# Patient Record
Sex: Female | Born: 1960
Health system: Southern US, Community
[De-identification: ages and names within clinical notes are randomized; demographics above are authoritative.]

## PROBLEM LIST (undated history)

## (undated) DIAGNOSIS — E785 Hyperlipidemia, unspecified: Secondary | ICD-10-CM

## (undated) DIAGNOSIS — I10 Essential (primary) hypertension: Secondary | ICD-10-CM

## (undated) DIAGNOSIS — H269 Unspecified cataract: Secondary | ICD-10-CM

## (undated) DIAGNOSIS — M1990 Unspecified osteoarthritis, unspecified site: Secondary | ICD-10-CM

## (undated) DIAGNOSIS — R7303 Prediabetes: Secondary | ICD-10-CM

## (undated) DIAGNOSIS — T7840XA Allergy, unspecified, initial encounter: Secondary | ICD-10-CM

## (undated) DIAGNOSIS — D649 Anemia, unspecified: Secondary | ICD-10-CM

## (undated) HISTORY — DX: Prediabetes: R73.03

## (undated) HISTORY — DX: Hyperlipidemia, unspecified: E78.5

## (undated) HISTORY — PX: TUBAL LIGATION: SHX77

## (undated) HISTORY — DX: Essential (primary) hypertension: I10

## (undated) HISTORY — DX: Anemia, unspecified: D64.9

## (undated) HISTORY — DX: Unspecified cataract: H26.9

## (undated) HISTORY — DX: Unspecified osteoarthritis, unspecified site: M19.90

## (undated) HISTORY — PX: ABDOMINAL HYSTERECTOMY: SHX81

## (undated) HISTORY — DX: Allergy, unspecified, initial encounter: T78.40XA

---

## 2005-03-15 ENCOUNTER — Ambulatory Visit: Payer: Self-pay | Admitting: Family Medicine

## 2007-09-26 ENCOUNTER — Ambulatory Visit: Payer: Self-pay

## 2011-08-18 ENCOUNTER — Ambulatory Visit: Payer: Self-pay | Admitting: Gastroenterology

## 2012-03-21 ENCOUNTER — Ambulatory Visit: Payer: Self-pay

## 2012-10-24 ENCOUNTER — Ambulatory Visit (INDEPENDENT_AMBULATORY_CARE_PROVIDER_SITE_OTHER): Payer: 59 | Admitting: Family Medicine

## 2012-10-24 ENCOUNTER — Encounter: Payer: Self-pay | Admitting: Family Medicine

## 2012-10-24 VITALS — BP 130/80 | HR 60 | Temp 97.8°F | Ht 62.25 in | Wt 147.0 lb

## 2012-10-24 DIAGNOSIS — E559 Vitamin D deficiency, unspecified: Secondary | ICD-10-CM

## 2012-10-24 DIAGNOSIS — I10 Essential (primary) hypertension: Secondary | ICD-10-CM

## 2012-10-24 DIAGNOSIS — D649 Anemia, unspecified: Secondary | ICD-10-CM

## 2012-10-24 DIAGNOSIS — Z0000 Encounter for general adult medical examination without abnormal findings: Secondary | ICD-10-CM

## 2012-10-24 DIAGNOSIS — Z136 Encounter for screening for cardiovascular disorders: Secondary | ICD-10-CM

## 2012-10-24 LAB — COMPREHENSIVE METABOLIC PANEL WITH GFR
ALT: 18 U/L (ref 0–35)
AST: 23 U/L (ref 0–37)
Albumin: 4.1 g/dL (ref 3.5–5.2)
Alkaline Phosphatase: 72 U/L (ref 39–117)
BUN: 27 mg/dL — ABNORMAL HIGH (ref 6–23)
CO2: 26 meq/L (ref 19–32)
Calcium: 10.3 mg/dL (ref 8.4–10.5)
Chloride: 104 meq/L (ref 96–112)
Creatinine, Ser: 0.8 mg/dL (ref 0.4–1.2)
GFR: 83.29 mL/min
Glucose, Bld: 104 mg/dL — ABNORMAL HIGH (ref 70–99)
Potassium: 4.5 meq/L (ref 3.5–5.1)
Sodium: 140 meq/L (ref 135–145)
Total Bilirubin: 0.4 mg/dL (ref 0.3–1.2)
Total Protein: 7.5 g/dL (ref 6.0–8.3)

## 2012-10-24 LAB — LIPID PANEL
Cholesterol: 237 mg/dL — ABNORMAL HIGH (ref 0–200)
HDL: 61.8 mg/dL
Total CHOL/HDL Ratio: 4
Triglycerides: 93 mg/dL (ref 0.0–149.0)
VLDL: 18.6 mg/dL (ref 0.0–40.0)

## 2012-10-24 LAB — CBC WITH DIFFERENTIAL/PLATELET
Basophils Relative: 0.4 % (ref 0.0–3.0)
Eosinophils Relative: 1.2 % (ref 0.0–5.0)
HCT: 38.2 % (ref 36.0–46.0)
Hemoglobin: 12.9 g/dL (ref 12.0–15.0)
Lymphs Abs: 2 10*3/uL (ref 0.7–4.0)
MCV: 86.3 fl (ref 78.0–100.0)
Monocytes Absolute: 0.4 10*3/uL (ref 0.1–1.0)
Neutro Abs: 3.8 10*3/uL (ref 1.4–7.7)
RBC: 4.43 Mil/uL (ref 3.87–5.11)
WBC: 6.4 10*3/uL (ref 4.5–10.5)

## 2012-10-24 LAB — LDL CHOLESTEROL, DIRECT: Direct LDL: 159.7 mg/dL

## 2012-10-24 NOTE — Progress Notes (Signed)
Subjective:    Patient ID: Joan Morgan, female    DOB: November 09, 1960, 52 y.o.   MRN: [id number]  HPI  Very pleasant G2P2 here to establish care.  HTN- on lisinopril 5 mg daily and has been on this for a few years. Denies any HA, blurred vision, CP or SOB.  Mammogram and colonoscopy last year.  Pap smear in 2012- no h/o abnormal pap smears.  H/o iron deficiency anemia- takes iron as needed.  CBC has not been checked in a year or so.  H/o vit d deficiency- takes vit d daily.  Has not had labs checked this year.  Patient Active Problem List  Diagnosis  . Anemia  . Hypertension  . Routine general medical examination at a health care facility   Past Medical History  Diagnosis Date  . Anemia   . Hypertension    Past Surgical History  Procedure Laterality Date  . Tubal ligation     History  Substance Use Topics  . Smoking status: Never Smoker   . Smokeless tobacco: Not on file  . Alcohol Use: Not on file   Family History  Problem Relation Age of Onset  . Hyperlipidemia Mother   . Hypertension Mother   . Hyperlipidemia Father    No Known Allergies No current outpatient prescriptions on file prior to visit.   No current facility-administered medications on file prior to visit.   The PMH, PSH, Social History, Family History, Medications, and allergies have been reviewed in [facility], and have been updated if relevant.   Review of Systems See HPI Patient reports no  vision/ hearing changes,anorexia, weight change, fever ,adenopathy, persistant / recurrent hoarseness, swallowing issues, chest pain, edema,persistant / recurrent cough, hemoptysis, dyspnea(rest, exertional, paroxysmal nocturnal), gastrointestinal  bleeding (melena, rectal bleeding), abdominal pain, excessive heart burn, GU symptoms(dysuria, hematuria, pyuria, voiding/incontinence  Issues) syncope, focal weakness, severe memory loss, concerning skin lesions, depression, anxiety, abnormal bruising/bleeding, major  joint swelling, breast masses or abnormal vaginal bleeding.       Objective:   Physical Exam BP 130/80  Pulse 60  Temp(Src) 97.8 F (36.6 C)  Ht 5' 2.25" (1.581 m)  Wt 147 lb (66.679 kg)  BMI 26.68 kg/m2  General:  Well-developed,well-nourished,in no acute distress; alert,appropriate and cooperative throughout examination Head:  normocephalic and atraumatic.   Eyes:  vision grossly intact, pupils equal, pupils round, and pupils reactive to light.   Ears:  R ear normal and L ear normal.   Nose:  no external deformity.   Mouth:  good dentition.   Lungs:  Normal respiratory effort, chest expands symmetrically. Lungs are clear to auscultation, no crackles or wheezes. Heart:  Normal rate and regular rhythm. S1 and S2 normal without gallop, murmur, click, rub or other extra sounds. Abdomen:  Bowel sounds positive,abdomen soft and non-tender without masses, organomegaly or hernias noted. Msk:  No deformity or scoliosis noted of thoracic or lumbar spine.   Extremities:  No clubbing, cyanosis, edema, or deformity noted with normal full range of motion of all joints.   Neurologic:  alert & oriented X3 and gait normal.   Skin:  Intact without suspicious lesions or rashes Cervical Nodes:  No lymphadenopathy noted Axillary Nodes:  No palpable lymphadenopathy Psych:  Cognition and judgment appear intact. Alert and cooperative with normal attention span and concentration. No apparent delusions, illusions, hallucinations'     Assessment & Plan:  1. Routine general medical examination at a health care facility Reviewed preventive care protocols, scheduled due services, and updated  immunizations Discussed nutrition, exercise, diet, and healthy lifestyle.  - Comprehensive metabolic panel  2. Screening for ischemic heart disease  - Lipid Panel  3. Anemia  - CBC with Differential  4. Hypertension Well controlled on low dose lisinopril.  5. Unspecified vitamin D deficiency  - Vitamin D,  25-hydroxy

## 2012-10-24 NOTE — Patient Instructions (Addendum)
Good to see you. We will call you with your lab results.  Try Miralax for constipation.

## 2013-01-07 ENCOUNTER — Other Ambulatory Visit: Payer: Self-pay

## 2013-01-07 MED ORDER — LISINOPRIL 5 MG PO TABS: 5.0000 mg | ORAL_TABLET | Freq: Every day | ORAL | Status: DC

## 2013-01-07 NOTE — Telephone Encounter (Signed)
Pt left v/m requesting refill Lisinopril to [facility] [address]. Unable to reach pt by phone to notify done.

## 2013-01-07 NOTE — Telephone Encounter (Signed)
Pt called to verify refill done. Advised pt done.

## 2013-01-24 ENCOUNTER — Encounter: Payer: Self-pay | Admitting: *Deleted

## 2013-01-24 NOTE — Telephone Encounter (Signed)
Encounter opened in error

## 2013-06-10 LAB — LIPID PANEL
Cholesterol: 213 mg/dL — AB (ref 0–200)
HDL: 59 mg/dL (ref 35–70)
LDL Cholesterol: 127 mg/dL
TRIGLYCERIDES: 134 mg/dL (ref 40–160)

## 2013-06-10 LAB — TSH: TSH: 1.52 u[IU]/mL (ref 0.41–5.90)

## 2013-06-10 LAB — CBC AND DIFFERENTIAL: HEMOGLOBIN: 13.2 g/dL (ref 12.0–16.0)

## 2013-06-10 LAB — BASIC METABOLIC PANEL: GLUCOSE: 89 mg/dL

## 2013-08-05 ENCOUNTER — Other Ambulatory Visit: Payer: Self-pay | Admitting: *Deleted

## 2013-08-05 MED ORDER — LISINOPRIL 5 MG PO TABS: 5.0000 mg | ORAL_TABLET | Freq: Every day | ORAL | Status: DC

## 2013-10-02 ENCOUNTER — Ambulatory Visit (INDEPENDENT_AMBULATORY_CARE_PROVIDER_SITE_OTHER): Payer: 59 | Admitting: Family Medicine

## 2013-10-02 ENCOUNTER — Encounter: Payer: Self-pay | Admitting: Family Medicine

## 2013-10-02 VITALS — BP 124/78 | HR 57 | Temp 98.3°F | Ht 62.0 in | Wt 139.5 lb

## 2013-10-02 DIAGNOSIS — B351 Tinea unguium: Secondary | ICD-10-CM | POA: Insufficient documentation

## 2013-10-02 DIAGNOSIS — I10 Essential (primary) hypertension: Secondary | ICD-10-CM

## 2013-10-02 DIAGNOSIS — Z0000 Encounter for general adult medical examination without abnormal findings: Secondary | ICD-10-CM

## 2013-10-02 MED ORDER — CICLOPIROX 8 % EX SOLN: Freq: Every day | CUTANEOUS | Status: DC

## 2013-10-02 MED ORDER — LISINOPRIL 5 MG PO TABS: 5.0000 mg | ORAL_TABLET | Freq: Every day | ORAL | Status: DC

## 2013-10-02 NOTE — Assessment & Plan Note (Signed)
Well controlled on low dose lisinopril. eRx sent to pharmacy. No changes.

## 2013-10-02 NOTE — Progress Notes (Signed)
Subjective:    Patient ID: Joan Morgan, female    DOB: 1961/05/13, 53 y.o.   MRN: [id number]  HPI  Very pleasant G2P2 here to establish care.  Brings in labs that were done at work.  Nail fungus- worsening- spreading to multiple toe nails.  Tried OTC lamisil without improvement.  HTN- on lisinopril 5 mg daily and has been on this for a few years. Denies any HA, blurred vision, CP or SOB.  Mammogram and colonoscopy two years ago.  Per pt, had pap smear done in [place] last year- no h/o abnormal pap smears. LMP 3 years ago- no post menopausal bleeding.  H/o iron deficiency anemia- takes iron as needed.   Lab Results  Component Value Date   WBC 6.4 10/24/2012   HGB 13.2 06/10/2013   HCT 38.2 10/24/2012   MCV 86.3 10/24/2012   PLT 298.0 10/24/2012     Patient Active Problem List   Diagnosis Date Noted  . Routine general medical examination at a health care facility 10/24/2012  . Anemia   . Hypertension    Past Medical History  Diagnosis Date  . Anemia   . Hypertension    Past Surgical History  Procedure Laterality Date  . Tubal ligation     History  Substance Use Topics  . Smoking status: Never Smoker   . Smokeless tobacco: Not on file  . Alcohol Use: Not on file   Family History  Problem Relation Age of Onset  . Hyperlipidemia Mother   . Hypertension Mother   . Hyperlipidemia Father    No Known Allergies Current Outpatient Prescriptions on File Prior to Visit  Medication Sig Dispense Refill  . cholecalciferol (VITAMIN D) 1000 UNITS tablet Take 1,000 Units by mouth daily.      Marland Kitchen lisinopril (PRINIVIL,ZESTRIL) 5 MG tablet Take 1 tablet (5 mg total) by mouth daily.  30 tablet  2  . Omega-3 Fatty Acids (FISH OIL) 1000 MG CAPS Take one by mouth daily       No current facility-administered medications on file prior to visit.   The PMH, PSH, Social History, Family History, Medications, and allergies have been reviewed in [facility], and have been updated if  relevant.   Review of Systems See HPI Patient reports no  vision/ hearing changes,anorexia, weight change, fever ,adenopathy, persistant / recurrent hoarseness, swallowing issues, chest pain, edema,persistant / recurrent cough, hemoptysis, dyspnea(rest, exertional, paroxysmal nocturnal), gastrointestinal  bleeding (melena, rectal bleeding), abdominal pain, excessive heart burn, GU symptoms(dysuria, hematuria, pyuria, voiding/incontinence  Issues) syncope, focal weakness, severe memory loss, concerning skin lesions, depression, anxiety, abnormal bruising/bleeding, major joint swelling, breast masses or abnormal vaginal bleeding.       Objective:   Physical Exam BP 124/78  Pulse 57  Temp(Src) 98.3 F (36.8 C) (Oral)  Ht 5\' 2"  (1.575 m)  Wt 139 lb 8 oz (63.277 kg)  BMI 25.51 kg/m2  SpO2 99%  General:  Well-developed,well-nourished,in no acute distress; alert,appropriate and cooperative throughout examination Head:  normocephalic and atraumatic.   Eyes:  vision grossly intact, pupils equal, pupils round, and pupils reactive to light.   Ears:  R ear normal and L ear normal.   Nose:  no external deformity.   Mouth:  good dentition.   Lungs:  Normal respiratory effort, chest expands symmetrically. Lungs are clear to auscultation, no crackles or wheezes. Heart:  Normal rate and regular rhythm. S1 and S2 normal without gallop, murmur, click, rub or other extra sounds. Abdomen:  Bowel sounds positive,abdomen soft  and non-tender without masses, organomegaly or hernias noted. Msk:  No deformity or scoliosis noted of thoracic or lumbar spine.   Extremities:  No clubbing, cyanosis, edema, or deformity noted with normal full range of motion of all joints. +changes of onchomycosis bilateral great toe nails and second toe nails   Neurologic:  alert & oriented X3 and gait normal.   Skin:  Intact without suspicious lesions or rashes Cervical Nodes:  No lymphadenopathy noted Axillary Nodes:  No  palpable lymphadenopathy Psych:  Cognition and judgment appear intact. Alert and cooperative with normal attention span and concentration. No apparent delusions, illusions, hallucinations'     Assessment & Plan:

## 2013-10-02 NOTE — Assessment & Plan Note (Signed)
She would like to try another topical agent before trying oral lamisil. rx for ciclopirox given to pt. Call or return to clinic prn if these symptoms worsen or fail to improve as anticipated. The patient indicates understanding of these issues and agrees with the plan.

## 2013-10-02 NOTE — Patient Instructions (Signed)
Great to see you. Please call [facility] to set up your mammogram.  Your labs look great.

## 2013-10-02 NOTE — Assessment & Plan Note (Signed)
Reviewed preventive care protocols, scheduled due services, and updated immunizations Discussed nutrition, exercise, diet, and healthy lifestyle. She will call to set up her mammogram.

## 2013-10-02 NOTE — Progress Notes (Signed)
Pre visit review using our clinic review tool, if applicable. No additional management support is needed unless otherwise documented below in the visit note. 

## 2013-10-10 ENCOUNTER — Telehealth: Payer: Self-pay

## 2013-10-10 NOTE — Telephone Encounter (Signed)
Patient Information:  Caller Name: [place]  Phone: [phone]  Patient: Joan Morgan  Gender: Female  DOB: 01-14-1961  Age: 53 Years  PCP: Arnette Norris [facility])  Pregnant: No  Office Follow Up:  Does the office need to follow up with this patient?: No  Instructions For The Office: N/A  RN Note:  Office contacted and Dr Damita Dunnings, he advised to schedule appt for am, do not take any additional meds, and if change or worsen prior to appt be seen  Symptoms  Reason For Call & Symptoms: Onset 3/11 at 19:00  Pt reports that she became suddenly lightheaded, nausea.  Lasting     BP 138/88 pt took extra Lisinapril, felt better but still dizziness.  Pt reports that she has mild dizziness Pt states that she wants to know if she should get checked.  Reviewed Health History In EMR: Yes  Reviewed Medications In EMR: Yes  Reviewed Allergies In EMR: Yes  Reviewed Surgeries / Procedures: Yes  Date of Onset of Symptoms: 10/09/2013 OB / GYN:  LMP: Unknown  Guideline(s) Used:  Dizziness  Disposition Per Guideline:   Go to Office Now  Reason For Disposition Reached:   Lightheadedness (dizziness) present now, after 2 hours of rest and fluids  Advice Given:  Call Back If:  You become worse.  RN Overrode Recommendation:  Make Appointment  Per Dr Damita Dunnings appt for 3/13 am  Appointment Scheduled:  10/11/2013 08:45:00 Appointment Scheduled Provider:  Webb Silversmith

## 2013-10-10 NOTE — Telephone Encounter (Signed)
Joan Morgan with CAN; last night pt felt dizzy,lightheaded and nauseated. Last night her BP was 138/88 and on her own pt took an extra lisinopril; pt slept thru the night. Now pt still has mild dizziness; no H/A, nausea, SOB,CP or weakness in limbs; pt can walk OK and BP is OK today, pt could not remember what BP was today. Pt is eating and drinking well. CAN has disposition to be seen in office now. Dr Damita Dunnings said if pt has improved from yesterday, to schedule appt in AM, take med as directed and if condition were to change or worsen prior to appt pt to go to UC. Joan Morgan voiced understanding. Pt has appt 10/11/13 at 8:45 AM with Webb Silversmith, NP.

## 2013-10-11 ENCOUNTER — Encounter: Payer: Self-pay | Admitting: Internal Medicine

## 2013-10-11 ENCOUNTER — Ambulatory Visit (INDEPENDENT_AMBULATORY_CARE_PROVIDER_SITE_OTHER): Payer: 59 | Admitting: Internal Medicine

## 2013-10-11 VITALS — BP 120/76 | HR 59 | Temp 98.3°F | Wt 136.5 lb

## 2013-10-11 DIAGNOSIS — R110 Nausea: Secondary | ICD-10-CM

## 2013-10-11 DIAGNOSIS — R42 Dizziness and giddiness: Secondary | ICD-10-CM

## 2013-10-11 MED ORDER — MECLIZINE HCL 50 MG PO TABS: 25.0000 mg | ORAL_TABLET | Freq: Three times a day (TID) | ORAL | Status: DC | PRN

## 2013-10-11 NOTE — Patient Instructions (Addendum)

## 2013-10-11 NOTE — Progress Notes (Signed)
Pre visit review using our clinic review tool, if applicable. No additional management support is needed unless otherwise documented below in the visit note. 

## 2013-10-11 NOTE — Progress Notes (Signed)
Subjective:    Patient ID: Joan Morgan, female    DOB: 1960-12-12, 53 y.o.   MRN: [id number]  HPI  Pt presents to the clinic today with c/o dizziness, lightheaded and nausea. She reports this started 3/11 at 7 pm. She took her BP at that time, 138/88. She did take an extra Lisinopril after taking her blood pressure. She did feel better after taking the lisinopril but still had some dizziness. She feels like the room is spinning. She denies chest pain, chest tightness or shortness of breath. She does report that she did have an ECG and stress test last year when she traveled back home to [place].   Review of Systems      Past Medical History  Diagnosis Date  . Anemia   . Hypertension     Current Outpatient Prescriptions  Medication Sig Dispense Refill  . cholecalciferol (VITAMIN D) 1000 UNITS tablet Take 1,000 Units by mouth daily.      . ciclopirox (PENLAC) 8 % solution Apply topically at bedtime. Apply over nail and surrounding skin. Apply daily over previous coat. After seven (7) days, may remove with alcohol and continue cycle.  6.6 mL  0  . lisinopril (PRINIVIL,ZESTRIL) 5 MG tablet Take 1 tablet (5 mg total) by mouth daily.  90 tablet  3  . Omega-3 Fatty Acids (FISH OIL) 1000 MG CAPS Take one by mouth daily       No current facility-administered medications for this visit.    No Known Allergies  Family History  Problem Relation Age of Onset  . Hyperlipidemia Mother   . Hypertension Mother   . Hyperlipidemia Father     History   Social History  . Marital Status: Married    Spouse Name: N/A    Number of Children: N/A  . Years of Education: N/A   Occupational History  . Not on file.   Social History Main Topics  . Smoking status: Never Smoker   . Smokeless tobacco: Not on file  . Alcohol Use: Not on file  . Drug Use: Not on file  . Sexual Activity: Not on file   Other Topics Concern  . Not on file   Social History Narrative   From [place].   Married, 2 sons ages 73 and 47 yo.   Works as Quarry manager at [employer].     Constitutional: Denies fever, malaise, fatigue, headache or abrupt weight changes.  HEENT: Pt reports blurred vision. Denies eye pain, eye redness, ear pain, ringing in the ears, wax buildup, runny nose, nasal congestion, bloody nose, or sore throat. Respiratory: Denies difficulty breathing, shortness of breath, cough or sputum production.   Cardiovascular: Denies chest pain, chest tightness, palpitations or swelling in the hands or feet.  Gastrointestinal: Pt reports nausea. Denies abdominal pain, bloating, constipation, diarrhea or blood in the stool.  Neurological: Pt reports dizziness. Deniesdifficulty with memory, difficulty with speech or problems with balance and coordination.   No other specific complaints in a complete review of systems (except as listed in HPI above).  Objective:   Physical Exam  BP 120/76  Pulse 59  Temp(Src) 98.3 F (36.8 C) (Oral)  Wt 136 lb 8 oz (61.916 kg)  SpO2 98% Wt Readings from Last 3 Encounters:  10/11/13 136 lb 8 oz (61.916 kg)  10/02/13 139 lb 8 oz (63.277 kg)  10/24/12 147 lb (66.679 kg)    General: Appears her stated age, well developed, well nourished in NAD. HEENT: Head: normal shape  and size; Eyes: sclera white, no icterus, conjunctiva pink, PERRLA and EOMs intact; Ears: Tm's gray and intact, normal light reflex; Nose: mucosa pink and moist, septum midline; Throat/Mouth: Teeth present, mucosa pink and moist, no exudate, lesions or ulcerations noted.  Cardiovascular: Normal rate and rhythm. S1,S2 noted.  No murmur, rubs or gallops noted. No JVD or BLE edema. No carotid bruits noted. Pulmonary/Chest: Normal effort and positive vesicular breath sounds. No respiratory distress. No wheezes, rales or ronchi noted.  Neurological: Alert and oriented. Cranial nerves II-XII intact. Coordination normal. +DTRs bilaterally.   BMET    Component Value Date/Time   NA 140  10/24/2012 1143   K 4.5 10/24/2012 1143   CL 104 10/24/2012 1143   CO2 26 10/24/2012 1143   GLUCOSE 104* 10/24/2012 1143   BUN 27* 10/24/2012 1143   CREATININE 0.8 10/24/2012 1143   CALCIUM 10.3 10/24/2012 1143    Lipid Panel     Component Value Date/Time   CHOL 213* 06/10/2013   TRIG 134 06/10/2013   HDL 59 06/10/2013   CHOLHDL 4 10/24/2012 1143   VLDL 18.6 10/24/2012 1143   LDLCALC 127 06/10/2013    CBC    Component Value Date/Time   WBC 6.4 10/24/2012 1143   RBC 4.43 10/24/2012 1143   HGB 13.2 06/10/2013   HCT 38.2 10/24/2012 1143   PLT 298.0 10/24/2012 1143   MCV 86.3 10/24/2012 1143   MCHC 33.7 10/24/2012 1143   RDW 13.5 10/24/2012 1143   LYMPHSABS 2.0 10/24/2012 1143   MONOABS 0.4 10/24/2012 1143   EOSABS 0.1 10/24/2012 1143   BASOSABS 0.0 10/24/2012 1143    Hgb A1C No results found for this basename: HGBA1C         Assessment & Plan:   Dizziness, nausea secondary to vertigo:  She reports she has had a normal heart workup and declines repeat ECG today Encouraged her to make sure she is drinking plenty of water to avoid dehydration eRx for meclizine TID prn dizziness Encouraged her to make position changes slowly  RTC as needed or if symptoms persist or worsen

## 2013-10-14 ENCOUNTER — Ambulatory Visit: Payer: Self-pay | Admitting: Family Medicine

## 2013-10-15 ENCOUNTER — Encounter: Payer: Self-pay | Admitting: Family Medicine

## 2013-11-11 ENCOUNTER — Other Ambulatory Visit: Payer: Self-pay | Admitting: Family Medicine

## 2014-04-02 ENCOUNTER — Other Ambulatory Visit: Payer: Self-pay | Admitting: Family Medicine

## 2014-07-02 ENCOUNTER — Telehealth: Payer: Self-pay

## 2014-07-02 NOTE — Telephone Encounter (Signed)
Pt request copy of tdap done in 2006 or 2007. Do not see copy of immunization scanned into pts chart; pt will ck with previous physician.

## 2014-09-07 ENCOUNTER — Encounter (HOSPITAL_COMMUNITY): Payer: Self-pay | Admitting: *Deleted

## 2014-09-07 ENCOUNTER — Emergency Department (HOSPITAL_COMMUNITY)
Admission: EM | Admit: 2014-09-07 | Discharge: 2014-09-07 | Disposition: A | Discharge status: Home / Self Care | Payer: No Typology Code available for payment source | Attending: Emergency Medicine | Admitting: Emergency Medicine

## 2014-09-07 DIAGNOSIS — S3992XA Unspecified injury of lower back, initial encounter: Secondary | ICD-10-CM | POA: Insufficient documentation

## 2014-09-07 DIAGNOSIS — M62830 Muscle spasm of back: Secondary | ICD-10-CM

## 2014-09-07 DIAGNOSIS — S199XXA Unspecified injury of neck, initial encounter: Secondary | ICD-10-CM | POA: Insufficient documentation

## 2014-09-07 DIAGNOSIS — I10 Essential (primary) hypertension: Secondary | ICD-10-CM | POA: Insufficient documentation

## 2014-09-07 DIAGNOSIS — Y9389 Activity, other specified: Secondary | ICD-10-CM | POA: Insufficient documentation

## 2014-09-07 DIAGNOSIS — Z79899 Other long term (current) drug therapy: Secondary | ICD-10-CM | POA: Insufficient documentation

## 2014-09-07 DIAGNOSIS — Y92410 Unspecified street and highway as the place of occurrence of the external cause: Secondary | ICD-10-CM | POA: Insufficient documentation

## 2014-09-07 DIAGNOSIS — S4991XA Unspecified injury of right shoulder and upper arm, initial encounter: Secondary | ICD-10-CM | POA: Insufficient documentation

## 2014-09-07 DIAGNOSIS — Y998 Other external cause status: Secondary | ICD-10-CM | POA: Insufficient documentation

## 2014-09-07 DIAGNOSIS — M62838 Other muscle spasm: Secondary | ICD-10-CM

## 2014-09-07 DIAGNOSIS — Z862 Personal history of diseases of the blood and blood-forming organs and certain disorders involving the immune mechanism: Secondary | ICD-10-CM | POA: Insufficient documentation
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
## 2014-10-08 ENCOUNTER — Other Ambulatory Visit (INDEPENDENT_AMBULATORY_CARE_PROVIDER_SITE_OTHER): Payer: 59

## 2014-10-08 ENCOUNTER — Other Ambulatory Visit: Payer: Self-pay | Admitting: Family Medicine

## 2014-10-08 DIAGNOSIS — E559 Vitamin D deficiency, unspecified: Secondary | ICD-10-CM

## 2014-10-08 DIAGNOSIS — Z0000 Encounter for general adult medical examination without abnormal findings: Secondary | ICD-10-CM

## 2014-10-08 LAB — COMPREHENSIVE METABOLIC PANEL
ALT: 18 U/L (ref 0–35)
AST: 17 U/L (ref 0–37)
Albumin: 4.6 g/dL (ref 3.5–5.2)
Alkaline Phosphatase: 83 U/L (ref 39–117)
BUN: 14 mg/dL (ref 6–23)
CALCIUM: 10 mg/dL (ref 8.4–10.5)
CO2: 30 mEq/L (ref 19–32)
Chloride: 104 mEq/L (ref 96–112)
Creatinine, Ser: 0.82 mg/dL (ref 0.40–1.20)
GFR: 76.89 mL/min (ref >60.00)
Glucose, Bld: 99 mg/dL (ref 70–99)
Potassium: 4.2 mEq/L (ref 3.5–5.1)
SODIUM: 141 meq/L (ref 135–145)
TOTAL PROTEIN: 7.9 g/dL (ref 6.0–8.3)
Total Bilirubin: 0.4 mg/dL (ref 0.2–1.2)

## 2014-10-08 LAB — VITAMIN D 25 HYDROXY (VIT D DEFICIENCY, FRACTURES): VITD: 30.75 ng/mL (ref 30.00–100.00)

## 2014-10-08 LAB — CBC WITH DIFFERENTIAL/PLATELET
BASOS ABS: 0 10*3/uL (ref 0.0–0.1)
BASOS PCT: 0.4 % (ref 0.0–3.0)
EOS PCT: 0.9 % (ref 0.0–5.0)
Eosinophils Absolute: 0.1 10*3/uL (ref 0.0–0.7)
HCT: 41.3 % (ref 36.0–46.0)
HEMOGLOBIN: 14 g/dL (ref 12.0–15.0)
Lymphocytes Relative: 36.3 % (ref 12.0–46.0)
Lymphs Abs: 2.6 10*3/uL (ref 0.7–4.0)
MCHC: 34 g/dL (ref 30.0–36.0)
MCV: 85.9 fl (ref 78.0–100.0)
MONOS PCT: 5.9 % (ref 3.0–12.0)
Monocytes Absolute: 0.4 10*3/uL (ref 0.1–1.0)
Neutro Abs: 4 10*3/uL (ref 1.4–7.7)
Neutrophils Relative %: 56.5 % (ref 43.0–77.0)
Platelets: 284 10*3/uL (ref 150.0–400.0)
RBC: 4.81 Mil/uL (ref 3.87–5.11)
RDW: 13.3 % (ref 11.5–15.5)
WBC: 7.1 10*3/uL (ref 4.0–10.5)

## 2014-10-08 LAB — LIPID PANEL
CHOL/HDL RATIO: 4
Cholesterol: 238 mg/dL — ABNORMAL HIGH (ref 0–200)
HDL: 63.1 mg/dL (ref >39.00)
LDL CALC: 154 mg/dL — AB (ref 0–99)
NonHDL: 174.9
Triglycerides: 103 mg/dL (ref 0.0–149.0)
VLDL: 20.6 mg/dL (ref 0.0–40.0)

## 2014-10-08 LAB — TSH: TSH: 1.72 u[IU]/mL (ref 0.35–4.50)

## 2014-10-08 NOTE — Addendum Note (Signed)
Addended by: Daralene Milch C on: 10/08/2014 11:45 AM   Modules accepted: Orders

## 2014-10-14 ENCOUNTER — Ambulatory Visit (INDEPENDENT_AMBULATORY_CARE_PROVIDER_SITE_OTHER): Payer: 59 | Admitting: Family Medicine

## 2014-10-14 ENCOUNTER — Encounter: Payer: Self-pay | Admitting: Family Medicine

## 2014-10-14 ENCOUNTER — Other Ambulatory Visit (HOSPITAL_COMMUNITY)
Admission: RE | Admit: 2014-10-14 | Discharge: 2014-10-14 | Disposition: A | Discharge status: Home / Self Care | Payer: 59 | Source: Ambulatory Visit | Attending: Family Medicine | Admitting: Family Medicine

## 2014-10-14 VITALS — BP 122/82 | HR 52 | Temp 98.3°F | Ht 62.5 in | Wt 135.5 lb

## 2014-10-14 DIAGNOSIS — B351 Tinea unguium: Secondary | ICD-10-CM

## 2014-10-14 DIAGNOSIS — Z01419 Encounter for gynecological examination (general) (routine) without abnormal findings: Secondary | ICD-10-CM | POA: Diagnosis not present

## 2014-10-14 DIAGNOSIS — Z1151 Encounter for screening for human papillomavirus (HPV): Secondary | ICD-10-CM | POA: Insufficient documentation

## 2014-10-14 DIAGNOSIS — Z0000 Encounter for general adult medical examination without abnormal findings: Secondary | ICD-10-CM

## 2014-10-14 DIAGNOSIS — I10 Essential (primary) hypertension: Secondary | ICD-10-CM

## 2014-10-14 DIAGNOSIS — E785 Hyperlipidemia, unspecified: Secondary | ICD-10-CM

## 2014-10-14 MED ORDER — TERBINAFINE HCL 250 MG PO TABS: 250.0000 mg | ORAL_TABLET | Freq: Every day | ORAL | Status: DC

## 2014-10-14 MED ORDER — PRAVASTATIN SODIUM 10 MG PO TABS: 10.0000 mg | ORAL_TABLET | Freq: Every day | ORAL | Status: DC

## 2014-10-14 MED ORDER — LISINOPRIL 5 MG PO TABS: 5.0000 mg | ORAL_TABLET | Freq: Every day | ORAL | Status: DC

## 2014-10-14 NOTE — Assessment & Plan Note (Signed)
Reviewed preventive care protocols, scheduled due services, and updated immunizations Discussed nutrition, exercise, diet, and healthy lifestyle.  Pap smear done today. 

## 2014-10-14 NOTE — Patient Instructions (Addendum)
Good to see you.  We are starting Lamisil 250 mg daily- may take up to 12 weeks.  We are also starting Pravastatin 10 mg nightly.  Please return in 6 weeks for lab work.

## 2014-10-14 NOTE — Addendum Note (Signed)
Addended by: Modena Nunnery on: 10/14/2014 09:22 AM   Modules accepted: Orders

## 2014-10-14 NOTE — Assessment & Plan Note (Signed)
Well controlled on current rx. No changes made today. 

## 2014-10-14 NOTE — Assessment & Plan Note (Signed)
Failed topical therapy. Start oral lamisil- eRx sent to pharmacy. May take for up to 12 weeks. She will return in 6 weeks for repeat liver function and CBC. The patient indicates understanding of these issues and agrees with the plan.

## 2014-10-14 NOTE — Assessment & Plan Note (Signed)
Deteriorated. Agrees to start statin.  eRx sent for pravachol 10 mg nightly. Repeat lipid panel in 6 weeks. The patient indicates understanding of these issues and agrees with the plan.

## 2014-10-14 NOTE — Progress Notes (Signed)
Subjective:    Patient ID: Joan Morgan, female    DOB: Dec 09, 1960, 54 y.o.   MRN: [id number]  HPI  Very pleasant G2P2 here for CPX/Pap   HTN- on lisinopril 5 mg daily and has been on this for a few years. Denies any HA, blurred vision, CP or SOB. Lab Results  Component Value Date   CREATININE 0.82 10/08/2014    Influenza vaccine 06/2014 Mammogram 10/14/13.  Colonoscopy 2013  Per pt, had pap smear done in [place] 2 years ago- no h/o abnormal pap smears. LMP4years ago- no post menopausal bleeding.  Onychomycosis- symptoms did improve somewhat with penlac but still present on her right foot.  H/o iron deficiency anemia- takes iron as needed.   Lab Results  Component Value Date   WBC 7.1 10/08/2014   HGB 14.0 10/08/2014   HCT 41.3 10/08/2014   MCV 85.9 10/08/2014   PLT 284.0 10/08/2014   HLD- deteriorated.  Strong FH of HLD. Healthy diet.  Does not eat fried or fatty foods. Lab Results  Component Value Date   CHOL 238* 10/08/2014   HDL 63.10 10/08/2014   LDLCALC 154* 10/08/2014   LDLDIRECT 159.7 10/24/2012   TRIG 103.0 10/08/2014   CHOLHDL 4 10/08/2014   Lab Results  Component Value Date   TSH 1.72 10/08/2014     Patient Active Problem List   Diagnosis Date Noted  . Nail fungus 10/02/2013  . Routine general medical examination at a health care facility 10/24/2012  . Anemia   . Hypertension    Past Medical History  Diagnosis Date  . Anemia   . Hypertension    Past Surgical History  Procedure Laterality Date  . Tubal ligation     History  Substance Use Topics  . Smoking status: Never Smoker   . Smokeless tobacco: Not on file  . Alcohol Use: No   Family History  Problem Relation Age of Onset  . Hyperlipidemia Mother   . Hypertension Mother   . Hyperlipidemia Father    No Known Allergies Current Outpatient Prescriptions on File Prior to Visit  Medication Sig Dispense Refill  . aspirin 81 MG chewable tablet Chew 81 mg by mouth daily.     . cholecalciferol (VITAMIN D) 1000 UNITS tablet Take 1,000 Units by mouth daily.    . ciclopirox (PENLAC) 8 % solution APPLY OVER NAIL AND SURROUNDING SKIN DAILY OVER PREVIOUS COAT AT BEDTIME. AFTER 7 DAYS REMOVE 6.6 mL 0  . lisinopril (PRINIVIL,ZESTRIL) 5 MG tablet Take 1 tablet (5 mg total) by mouth daily. 90 tablet 3  . meclizine (ANTIVERT) 50 MG tablet Take 0.5 tablets (25 mg total) by mouth 3 (three) times daily as needed. 30 tablet 0  . Multiple Vitamin (MULTIVITAMIN WITH MINERALS) TABS tablet Take 1 tablet by mouth daily.    . Omega-3 Fatty Acids (FISH OIL) 1000 MG CAPS Take one by mouth daily     No current facility-administered medications on file prior to visit.   The PMH, PSH, Social History, Family History, Medications, and allergies have been reviewed in [facility], and have been updated if relevant.   Review of Systems  Constitutional: Negative.   HENT: Negative.   Eyes: Negative.   Respiratory: Negative.   Cardiovascular: Negative.   Endocrine: Negative.   Genitourinary: Negative.   Musculoskeletal: Negative.   Skin: Negative.   Allergic/Immunologic: Negative.   Neurological: Negative.   Hematological: Negative.   Psychiatric/Behavioral: Negative.   All other systems reviewed and are negative.  Objective:   Physical Exam BP 122/82 mmHg  Pulse 52  Temp(Src) 98.3 F (36.8 C) (Oral)  Ht 5' 2.5" (1.588 m)  Wt 135 lb 8 oz (61.462 kg)  BMI 24.37 kg/m2  SpO2 98%   General:  Well-developed,well-nourished,in no acute distress; alert,appropriate and cooperative throughout examination Head:  normocephalic and atraumatic.   Eyes:  vision grossly intact, pupils equal, pupils round, and pupils reactive to light.   Ears:  R ear normal and L ear normal.   Nose:  no external deformity.   Mouth:  good dentition.   Neck:  No deformities, masses, or tenderness noted. Breasts:  No mass, nodules, thickening, tenderness, bulging, retraction, inflamation, nipple discharge or  skin changes noted.   Lungs:  Normal respiratory effort, chest expands symmetrically. Lungs are clear to auscultation, no crackles or wheezes. Heart:  Normal rate and regular rhythm. S1 and S2 normal without gallop, murmur, click, rub or other extra sounds. Abdomen:  Bowel sounds positive,abdomen soft and non-tender without masses, organomegaly or hernias noted. Rectal:  no external abnormalities.   Genitalia:  Pelvic Exam:        External: normal female genitalia without lesions or masses        Vagina: normal without lesions or masses        Cervix: normal without lesions or masses        Adnexa: normal bimanual exam without masses or fullness        Uterus: normal by palpation        Pap smear: performed Msk:  No deformity or scoliosis noted of thoracic or lumbar spine.   Extremities:  No clubbing, cyanosis, edema, or deformity noted with normal full range of motion of all joints.   changes of onchomycosis bilateral great toe nails and second toe nails, right > left Neurologic:  alert & oriented X3 and gait normal.   Skin:  Intact without suspicious lesions or rashes Cervical Nodes:  No lymphadenopathy noted Axillary Nodes:  No palpable lymphadenopathy Psych:  Cognition and judgment appear intact. Alert and cooperative with normal attention span and concentration. No apparent delusions, illusions, hallucinations     Assessment & Plan:

## 2014-10-14 NOTE — Progress Notes (Signed)
Pre visit review using our clinic review tool, if applicable. No additional management support is needed unless otherwise documented below in the visit note. 

## 2014-10-15 LAB — CYTOLOGY - PAP

## 2014-10-16 ENCOUNTER — Encounter: Payer: Self-pay | Admitting: *Deleted

## 2014-12-03 ENCOUNTER — Other Ambulatory Visit: Payer: 59

## 2014-12-04 ENCOUNTER — Telehealth: Payer: Self-pay | Admitting: Family Medicine

## 2014-12-04 ENCOUNTER — Encounter: Payer: Self-pay | Admitting: Family Medicine

## 2014-12-04 ENCOUNTER — Other Ambulatory Visit (INDEPENDENT_AMBULATORY_CARE_PROVIDER_SITE_OTHER): Payer: 59

## 2014-12-04 DIAGNOSIS — E785 Hyperlipidemia, unspecified: Secondary | ICD-10-CM | POA: Diagnosis not present

## 2014-12-04 DIAGNOSIS — B351 Tinea unguium: Secondary | ICD-10-CM | POA: Diagnosis not present

## 2014-12-04 LAB — COMPREHENSIVE METABOLIC PANEL
ALT: 17 U/L (ref 0–35)
AST: 19 U/L (ref 0–37)
Albumin: 4.1 g/dL (ref 3.5–5.2)
Alkaline Phosphatase: 70 U/L (ref 39–117)
BUN: 12 mg/dL (ref 6–23)
CO2: 29 mEq/L (ref 19–32)
Calcium: 9.8 mg/dL (ref 8.4–10.5)
Chloride: 108 mEq/L (ref 96–112)
Creatinine, Ser: 0.83 mg/dL (ref 0.40–1.20)
GFR: 75.78 mL/min (ref >60.00)
GLUCOSE: 100 mg/dL — AB (ref 70–99)
POTASSIUM: 4.1 meq/L (ref 3.5–5.1)
Sodium: 143 mEq/L (ref 135–145)
TOTAL PROTEIN: 7.9 g/dL (ref 6.0–8.3)
Total Bilirubin: 0.3 mg/dL (ref 0.2–1.2)

## 2014-12-04 LAB — LIPID PANEL
CHOLESTEROL: 218 mg/dL — AB (ref 0–200)
HDL: 67.4 mg/dL (ref >39.00)
LDL Cholesterol: 137 mg/dL — ABNORMAL HIGH (ref 0–99)
NONHDL: 150.6
Total CHOL/HDL Ratio: 3
Triglycerides: 70 mg/dL (ref 0.0–149.0)
VLDL: 14 mg/dL (ref 0.0–40.0)

## 2014-12-04 LAB — CBC WITH DIFFERENTIAL/PLATELET
BASOS PCT: 0.7 % (ref 0.0–3.0)
Basophils Absolute: 0 10*3/uL (ref 0.0–0.1)
EOS ABS: 0.1 10*3/uL (ref 0.0–0.7)
Eosinophils Relative: 1.3 % (ref 0.0–5.0)
HCT: 39.6 % (ref 36.0–46.0)
HEMOGLOBIN: 13.4 g/dL (ref 12.0–15.0)
Lymphocytes Relative: 35 % (ref 12.0–46.0)
Lymphs Abs: 1.7 10*3/uL (ref 0.7–4.0)
MCHC: 33.9 g/dL (ref 30.0–36.0)
MCV: 87.2 fl (ref 78.0–100.0)
Monocytes Absolute: 0.3 10*3/uL (ref 0.1–1.0)
Monocytes Relative: 6.5 % (ref 3.0–12.0)
NEUTROS ABS: 2.8 10*3/uL (ref 1.4–7.7)
Neutrophils Relative %: 56.5 % (ref 43.0–77.0)
Platelets: 344 10*3/uL (ref 150.0–400.0)
RBC: 4.54 Mil/uL (ref 3.87–5.11)
RDW: 13.6 % (ref 11.5–15.5)
WBC: 5 10*3/uL (ref 4.0–10.5)

## 2014-12-04 NOTE — Telephone Encounter (Signed)
Pt came in today for lab work  She would like a copy of the results mailed to her

## 2015-01-22 ENCOUNTER — Ambulatory Visit (INDEPENDENT_AMBULATORY_CARE_PROVIDER_SITE_OTHER): Payer: 59 | Admitting: Family Medicine

## 2015-01-22 ENCOUNTER — Encounter: Payer: Self-pay | Admitting: Family Medicine

## 2015-01-22 ENCOUNTER — Ambulatory Visit (INDEPENDENT_AMBULATORY_CARE_PROVIDER_SITE_OTHER)
Admission: RE | Admit: 2015-01-22 | Discharge: 2015-01-22 | Disposition: A | Discharge status: Home / Self Care | Payer: 59 | Source: Ambulatory Visit | Attending: Family Medicine | Admitting: Family Medicine

## 2015-01-22 VITALS — BP 120/78 | HR 50 | Temp 97.9°F | Wt 142.5 lb

## 2015-01-22 DIAGNOSIS — R059 Cough, unspecified: Secondary | ICD-10-CM

## 2015-01-22 DIAGNOSIS — I10 Essential (primary) hypertension: Secondary | ICD-10-CM | POA: Diagnosis not present

## 2015-01-22 DIAGNOSIS — E785 Hyperlipidemia, unspecified: Secondary | ICD-10-CM

## 2015-01-22 DIAGNOSIS — R05 Cough: Secondary | ICD-10-CM

## 2015-01-22 NOTE — Progress Notes (Signed)
Subjective:   Patient ID: Joan Morgan, female    DOB: 03/07/1961, 54 y.o.   MRN: [id number]  Joan Morgan is a pleasant 54 y.o. year old female who presents to clinic today with Follow-up  on 01/22/2015  HPI:  HLD- started pravachol 10 mg daily in March.  She stopped taking it last week- -she feels it is causing increased thirst and fatigue. Fasting glucose 100 last month.  Also noting progressive dry, hacking cough.    No CP or SOB.  She is a non smoker.  Does not have GERD typically.  She did have asthma as a child.  Strong FH of HLD.  Lab Results  Component Value Date   CHOL 218* 12/04/2014   HDL 67.40 12/04/2014   LDLCALC 137* 12/04/2014   LDLDIRECT 159.7 10/24/2012   TRIG 70.0 12/04/2014   CHOLHDL 3 12/04/2014    Current Outpatient Prescriptions on File Prior to Visit  Medication Sig Dispense Refill  . aspirin 81 MG chewable tablet Chew 81 mg by mouth daily.    . meclizine (ANTIVERT) 50 MG tablet Take 0.5 tablets (25 mg total) by mouth 3 (three) times daily as needed. 30 tablet 0  . Multiple Vitamin (MULTIVITAMIN WITH MINERALS) TABS tablet Take 1 tablet by mouth daily.    . Omega-3 Fatty Acids (FISH OIL) 1000 MG CAPS Take one by mouth daily    . cholecalciferol (VITAMIN D) 1000 UNITS tablet Take 1,000 Units by mouth daily.     No current facility-administered medications on file prior to visit.    No Known Allergies  Past Medical History  Diagnosis Date  . Anemia   . Hypertension     Past Surgical History  Procedure Laterality Date  . Tubal ligation      Family History  Problem Relation Age of Onset  . Hyperlipidemia Mother   . Hypertension Mother   . Hyperlipidemia Father     History   Social History  . Marital Status: Married    Spouse Name: N/A  . Number of Children: N/A  . Years of Education: N/A   Occupational History  . Not on file.   Social History Main Topics  . Smoking status: Never Smoker   . Smokeless tobacco: Not on  file  . Alcohol Use: No  . Drug Use: No  . Sexual Activity: Not on file   Other Topics Concern  . Not on file   Social History Narrative   From [place].   Married, 2 sons ages 12 and 64 yo.   Works as Quarry manager at [employer].   The PMH, [place], Social History, Family History, Medications, and allergies have been reviewed in [facility], and have been updated if relevant.   Review of Systems  Constitutional: Positive for fatigue.  HENT: Negative.   Eyes: Negative.   Respiratory: Positive for cough. Negative for shortness of breath, wheezing and stridor.   Gastrointestinal: Negative.   Endocrine: Positive for polydipsia and polyuria.  Musculoskeletal: Positive for myalgias.  Allergic/Immunologic: Negative.   Neurological: Negative.   Hematological: Negative.   Psychiatric/Behavioral: Negative.   All other systems reviewed and are negative.      Objective:    BP 120/78 mmHg  Pulse 50  Temp(Src) 97.9 F (36.6 C) (Oral)  Wt 142 lb 8 oz (64.638 kg)  SpO2 99%  Wt Readings from Last 3 Encounters:  01/22/15 142 lb 8 oz (64.638 kg)  10/14/14 135 lb 8 oz (61.462 kg)  10/11/13 136 lb 8  oz (61.916 kg)     Physical Exam  Constitutional: She is oriented to person, place, and time. She appears well-developed and well-nourished. No distress.  Eyes: Conjunctivae are normal.  Neck: Normal range of motion.  Cardiovascular: Normal rate, regular rhythm and normal heart sounds.   Pulmonary/Chest: Effort normal and breath sounds normal. No respiratory distress. She has no wheezes. She has no rales. She exhibits no tenderness.  Musculoskeletal: She exhibits no edema.  Neurological: She is alert and oriented to person, place, and time. No cranial nerve deficit.  Skin: Skin is warm and dry.  Psychiatric: She has a normal mood and affect. Her behavior is normal. Judgment and thought content normal.  Nursing note and vitals reviewed.         Assessment & Plan:   HLD  (hyperlipidemia) No Follow-up on file.

## 2015-01-22 NOTE — Assessment & Plan Note (Signed)
New- lung exam reassuring. ?allergic rhinitis vs GERD vs ACEI. Hold ACEI, CXR today. Follow up in 3 weeks. The patient indicates understanding of these issues and agrees with the plan.

## 2015-01-22 NOTE — Assessment & Plan Note (Signed)
Well controlled on low dose ACEI. >25 minutes spent in face to face time with patient, >50% spent in counselling or coordination of care Discussed d/c'ing lisinopril for now since BP well controlled and could potentially be causing her cough although she has been taking it for years. The patient indicates understanding of these issues and agrees with the plan. D/c lisinopril- increase walking- 30 minutes per day- and follow up with me in 3 weeks for BP recheck and labs.

## 2015-01-22 NOTE — Progress Notes (Signed)
Pre visit review using our clinic review tool, if applicable. No additional management support is needed unless otherwise documented below in the visit note. 

## 2015-01-22 NOTE — Patient Instructions (Signed)
Good to see you.  Please come see me in 3 weeks.  Please STOP taking lisinopril.  Walk 30-45 minutes per day.

## 2015-01-22 NOTE — Assessment & Plan Note (Signed)
She has not restarted pravachol.  Will not restart any rx for now- recheck lipid panel when she returns in 3 weeks. The patient indicates understanding of these issues and agrees with the plan.

## 2015-02-09 ENCOUNTER — Other Ambulatory Visit: Payer: Self-pay | Admitting: Family Medicine

## 2015-02-09 NOTE — Telephone Encounter (Signed)
Received refill request electronically Last office visit 01/22/15 Medication is no longer on medication list. Is it okay to refill medication?

## 2015-02-12 ENCOUNTER — Encounter: Payer: Self-pay | Admitting: Family Medicine

## 2015-02-12 ENCOUNTER — Ambulatory Visit (INDEPENDENT_AMBULATORY_CARE_PROVIDER_SITE_OTHER): Payer: 59 | Admitting: Family Medicine

## 2015-02-12 VITALS — BP 116/80 | HR 48 | Temp 97.7°F | Wt 139.5 lb

## 2015-02-12 DIAGNOSIS — E785 Hyperlipidemia, unspecified: Secondary | ICD-10-CM | POA: Diagnosis not present

## 2015-02-12 DIAGNOSIS — R05 Cough: Secondary | ICD-10-CM

## 2015-02-12 DIAGNOSIS — I10 Essential (primary) hypertension: Secondary | ICD-10-CM

## 2015-02-12 DIAGNOSIS — R059 Cough, unspecified: Secondary | ICD-10-CM

## 2015-02-12 LAB — COMPREHENSIVE METABOLIC PANEL
ALT: 18 U/L (ref 0–35)
AST: 19 U/L (ref 0–37)
Albumin: 4.1 g/dL (ref 3.5–5.2)
Alkaline Phosphatase: 65 U/L (ref 39–117)
BILIRUBIN TOTAL: 0.3 mg/dL (ref 0.2–1.2)
BUN: 10 mg/dL (ref 6–23)
CO2: 32 mEq/L (ref 19–32)
Calcium: 10 mg/dL (ref 8.4–10.5)
Chloride: 106 mEq/L (ref 96–112)
Creatinine, Ser: 0.81 mg/dL (ref 0.40–1.20)
GFR: 77.89 mL/min (ref >60.00)
Glucose, Bld: 86 mg/dL (ref 70–99)
Potassium: 3.7 mEq/L (ref 3.5–5.1)
Sodium: 143 mEq/L (ref 135–145)
Total Protein: 7.6 g/dL (ref 6.0–8.3)

## 2015-02-12 LAB — LIPID PANEL
CHOL/HDL RATIO: 4
Cholesterol: 230 mg/dL — ABNORMAL HIGH (ref 0–200)
HDL: 55.1 mg/dL (ref >39.00)
LDL CALC: 149 mg/dL — AB (ref 0–99)
NONHDL: 174.9
Triglycerides: 130 mg/dL (ref 0.0–149.0)
VLDL: 26 mg/dL (ref 0.0–40.0)

## 2015-02-12 NOTE — Assessment & Plan Note (Signed)
Well controlled without rx. Will not restart antihypertensive.

## 2015-02-12 NOTE — Progress Notes (Signed)
Subjective:   Patient ID: Joan Morgan, female    DOB: Jul 24, 1961, 54 y.o.   MRN: [id number]  Joan Morgan is a pleasant 54 y.o. year old female who presents to clinic today with Follow-up  on 02/12/2015  HPI:  Here for follow up cough/hypertension.  When I saw her on 6/23 (3 weeks ago), she was complaining of a persistent cough.  Since she was normotensive on low dose ACEI, I advised that she stop taking lisinopril and follow up with me here today.  Cough has resolved completely.  She also had not restarted her pravachol at that that but has since done so. Lab Results  Component Value Date   CREATININE 0.83 12/04/2014   Lab Results  Component Value Date   CHOL 218* 12/04/2014   HDL 67.40 12/04/2014   LDLCALC 137* 12/04/2014   LDLDIRECT 159.7 10/24/2012   TRIG 70.0 12/04/2014   CHOLHDL 3 12/04/2014   Current Outpatient Prescriptions on File Prior to Visit  Medication Sig Dispense Refill  . aspirin 81 MG chewable tablet Chew 81 mg by mouth daily.    . cholecalciferol (VITAMIN D) 1000 UNITS tablet Take 1,000 Units by mouth daily.    . meclizine (ANTIVERT) 50 MG tablet Take 0.5 tablets (25 mg total) by mouth 3 (three) times daily as needed. 30 tablet 0  . Multiple Vitamin (MULTIVITAMIN WITH MINERALS) TABS tablet Take 1 tablet by mouth daily.    . Omega-3 Fatty Acids (FISH OIL) 1000 MG CAPS Take one by mouth daily    . terbinafine (LAMISIL) 250 MG tablet TAKE 1 TABLET (250 MG TOTAL) BY MOUTH DAILY. 30 tablet 3   No current facility-administered medications on file prior to visit.    No Known Allergies  Past Medical History  Diagnosis Date  . Anemia   . Hypertension     Past Surgical History  Procedure Laterality Date  . Tubal ligation      Family History  Problem Relation Age of Onset  . Hyperlipidemia Mother   . Hypertension Mother   . Hyperlipidemia Father     History   Social History  . Marital Status: Married    Spouse Name: N/A  . Number  of Children: N/A  . Years of Education: N/A   Occupational History  . Not on file.   Social History Main Topics  . Smoking status: Never Smoker   . Smokeless tobacco: Not on file  . Alcohol Use: No  . Drug Use: No  . Sexual Activity: Not on file   Other Topics Concern  . Not on file   Social History Narrative   From [place].   Married, 2 sons ages 89 and 69 yo.   Works as Quarry manager at [employer].   The PMH, [place], Social History, Family History, Medications, and allergies have been reviewed in [facility], and have been updated if relevant.   Review of Systems  Constitutional: Negative.   HENT: Negative.   Respiratory: Negative.   Cardiovascular: Negative.   Gastrointestinal: Negative.   Musculoskeletal: Negative.   Skin: Negative.   Neurological: Negative.   Psychiatric/Behavioral: Negative.   All other systems reviewed and are negative.      Objective:    BP 116/80 mmHg  Pulse 48  Temp(Src) 97.7 F (36.5 C) (Oral)  Wt 139 lb 8 oz (63.277 kg)  SpO2 98%   Physical Exam  Constitutional: She is oriented to person, place, and time. She appears well-developed and well-nourished. No distress.  HENT:  Head: Normocephalic and atraumatic.  Eyes: Conjunctivae are normal.  Neck: Normal range of motion.  Cardiovascular: Normal rate and regular rhythm.   Pulmonary/Chest: Effort normal and breath sounds normal. No respiratory distress. She has no wheezes.  Musculoskeletal: She exhibits no edema.  Neurological: She is alert and oriented to person, place, and time. No cranial nerve deficit.  Skin: Skin is warm and dry.  Psychiatric: She has a normal mood and affect. Her behavior is normal. Judgment and thought content normal.  Nursing note and vitals reviewed.         Assessment & Plan:   Essential hypertension  Cough  HLD (hyperlipidemia) - Plan: Lipid panel, Comprehensive metabolic panel No Follow-up on file.

## 2015-02-12 NOTE — Patient Instructions (Signed)
Great to see you.  We will call you with your lab results. 

## 2015-02-12 NOTE — Assessment & Plan Note (Signed)
Resolved. ? Late onset ACEI allergy.  BP looks great- will not restart ACEI or any other antihypertensive at this time.  ACEI added to allergy list.

## 2015-02-12 NOTE — Assessment & Plan Note (Signed)
Check lipid panel today 

## 2015-02-12 NOTE — Progress Notes (Signed)
Pre visit review using our clinic review tool, if applicable. No additional management support is needed unless otherwise documented below in the visit note. 

## 2015-03-06 ENCOUNTER — Other Ambulatory Visit: Payer: Self-pay | Admitting: Family Medicine

## 2015-06-04 ENCOUNTER — Encounter: Payer: Self-pay | Admitting: Family Medicine

## 2015-06-04 ENCOUNTER — Ambulatory Visit (INDEPENDENT_AMBULATORY_CARE_PROVIDER_SITE_OTHER): Payer: 59 | Admitting: Family Medicine

## 2015-06-04 VITALS — BP 162/92 | HR 49 | Temp 97.9°F | Wt 137.8 lb

## 2015-06-04 DIAGNOSIS — B351 Tinea unguium: Secondary | ICD-10-CM

## 2015-06-04 DIAGNOSIS — I10 Essential (primary) hypertension: Secondary | ICD-10-CM

## 2015-06-04 MED ORDER — AMLODIPINE BESYLATE 5 MG PO TABS: 5.0000 mg | ORAL_TABLET | Freq: Every day | ORAL | Status: DC

## 2015-06-04 NOTE — Progress Notes (Signed)
Subjective:   Patient ID: Joan Morgan, female    DOB: Jan 23, 1961, 54 y.o.   MRN: [id number]  Joan Morgan is a pleasant 54 y.o. year old female who presents to clinic today with Hypertension  on 06/04/2015  HPI:  Here for follow up hypertension.  Past month, blood pressure has been increasing.  Some headaches, no blurred vision.  When I saw her on 6/23 (55 weeks ago), she was complaining of a persistent cough which resolved once she stopped lisinopril.  Per pt, HCTZ did not work when she tried that previously.  Has been under more stress lately.    Onychomycosis of toes nails- has failed oral and topical rx.  Feels it is "just not getting better." No surrounding nail erythema.  Lab Results  Component Value Date   CREATININE 0.81 02/12/2015   Lab Results  Component Value Date   CHOL 230* 02/12/2015   HDL 55.10 02/12/2015   LDLCALC 149* 02/12/2015   LDLDIRECT 159.7 10/24/2012   TRIG 130.0 02/12/2015   CHOLHDL 4 02/12/2015   Current Outpatient Prescriptions on File Prior to Visit  Medication Sig Dispense Refill  . aspirin 81 MG chewable tablet Chew 81 mg by mouth daily.    . cholecalciferol (VITAMIN D) 1000 UNITS tablet Take 1,000 Units by mouth daily.    . meclizine (ANTIVERT) 50 MG tablet Take 0.5 tablets (25 mg total) by mouth 3 (three) times daily as needed. 30 tablet 0  . Multiple Vitamin (MULTIVITAMIN WITH MINERALS) TABS tablet Take 1 tablet by mouth daily.    . Omega-3 Fatty Acids (FISH OIL) 1000 MG CAPS Take one by mouth daily    . pravastatin (PRAVACHOL) 10 MG tablet TAKE 1 TABLET (10 MG TOTAL) BY MOUTH DAILY. 30 tablet 5  . terbinafine (LAMISIL) 250 MG tablet TAKE 1 TABLET (250 MG TOTAL) BY MOUTH DAILY. 30 tablet 3   No current facility-administered medications on file prior to visit.    Allergies  Allergen Reactions  . Ace Inhibitors     cough    Past Medical History  Diagnosis Date  . Anemia   . Hypertension     Past Surgical History    Procedure Laterality Date  . Tubal ligation      Family History  Problem Relation Age of Onset  . Hyperlipidemia Mother   . Hypertension Mother   . Hyperlipidemia Father     Social History   Social History  . Marital Status: Married    Spouse Name: N/A  . Number of Children: N/A  . Years of Education: N/A   Occupational History  . Not on file.   Social History Main Topics  . Smoking status: Never Smoker   . Smokeless tobacco: Not on file  . Alcohol Use: No  . Drug Use: No  . Sexual Activity: Not on file   Other Topics Concern  . Not on file   Social History Narrative   From [place].   Married, 2 sons ages 65 and 51 yo.   Works as Quarry manager at [employer].   The PMH, [place], Social History, Family History, Medications, and allergies have been reviewed in [facility], and have been updated if relevant.   Review of Systems  Constitutional: Negative.   Respiratory: Negative.   Cardiovascular: Negative.   Gastrointestinal: Negative.   Musculoskeletal: Negative.   Skin: Negative.   Neurological: Positive for headaches. Negative for dizziness, tremors, seizures, syncope, facial asymmetry, speech difficulty, weakness, light-headedness and numbness.  Hematological: Negative.  Psychiatric/Behavioral: Negative.   All other systems reviewed and are negative.      Objective:    BP 162/92 mmHg  Pulse 49  Temp(Src) 97.9 F (36.6 C) (Oral)  Wt 137 lb 12 oz (62.483 kg)  SpO2 99%   Physical Exam  Constitutional: She is oriented to person, place, and time. She appears well-developed and well-nourished. No distress.  HENT:  Head: Normocephalic and atraumatic.  Eyes: Conjunctivae are normal.  Neck: Normal range of motion.  Cardiovascular: Normal rate and regular rhythm.   Pulmonary/Chest: Effort normal and breath sounds normal. No respiratory distress. She has no wheezes.  Musculoskeletal: She exhibits no edema.  Neurological: She is alert and oriented to person,  place, and time. No cranial nerve deficit.  Skin: Skin is warm and dry.  Psychiatric: She has a normal mood and affect. Her behavior is normal. Judgment and thought content normal.  Nursing note and vitals reviewed.         Assessment & Plan:   Onychomycosis - Plan: Ambulatory referral to Dermatology  Essential hypertension No Follow-up on file.

## 2015-06-04 NOTE — Assessment & Plan Note (Signed)
Persistent.  Has failed oral and topical rx. Refer to dermatology.

## 2015-06-04 NOTE — Patient Instructions (Signed)
Great to see you. We are starting amlodipine 5 mg daily.  Please check your blood pressures at home daily and email me. Call me if you have any concerns.  You can also follow up with me in 2 weeks.

## 2015-06-04 NOTE — Progress Notes (Signed)
Pre visit review using our clinic review tool, if applicable. No additional management support is needed unless otherwise documented below in the visit note. 

## 2015-06-04 NOTE — Assessment & Plan Note (Signed)
Deteriorated. Discussed tx options.  eRx sent for amlodipine 5 mg daily.  She check BP at home regularly.  She will continue to check and then update me with her readings.  Additionally, she can follow up with me in 2 weeks. The patient indicates understanding of these issues and agrees with the plan.

## 2015-06-08 ENCOUNTER — Other Ambulatory Visit: Payer: Self-pay | Admitting: Family Medicine

## 2015-06-22 ENCOUNTER — Ambulatory Visit: Payer: 59 | Admitting: Family Medicine

## 2015-06-24 ENCOUNTER — Ambulatory Visit (INDEPENDENT_AMBULATORY_CARE_PROVIDER_SITE_OTHER): Payer: 59 | Admitting: Family Medicine

## 2015-06-24 ENCOUNTER — Encounter: Payer: Self-pay | Admitting: Family Medicine

## 2015-06-24 VITALS — BP 122/72 | HR 59 | Temp 98.4°F | Wt 134.5 lb

## 2015-06-24 DIAGNOSIS — I10 Essential (primary) hypertension: Secondary | ICD-10-CM

## 2015-06-24 NOTE — Progress Notes (Signed)
Subjective:   Patient ID: Joan Morgan, female    DOB: June 28, 1961, 54 y.o.   MRN: [id number]  Joan Morgan is a pleasant 53 y.o. year old female who presents to clinic today with Follow-up  on 06/24/2015  HPI:  Here for follow up HTN.  When I saw her on 6/23, she was complaining of a persistent cough.  Since she was normotensive on low dose ACEI, I advised that she stop taking lisinopril and follow up with me here today.  Cough has resolved completely.  Started amlodipine 5 mg daily last month. Here for follow up.  Denies any side effects. Lab Results  Component Value Date   CREATININE 0.81 02/12/2015   Lab Results  Component Value Date   CHOL 230* 02/12/2015   HDL 55.10 02/12/2015   LDLCALC 149* 02/12/2015   LDLDIRECT 159.7 10/24/2012   TRIG 130.0 02/12/2015   CHOLHDL 4 02/12/2015   Current Outpatient Prescriptions on File Prior to Visit  Medication Sig Dispense Refill  . amLODipine (NORVASC) 5 MG tablet Take 1 tablet (5 mg total) by mouth daily. 30 tablet 3  . aspirin 81 MG chewable tablet Chew 81 mg by mouth daily.    . cholecalciferol (VITAMIN D) 1000 UNITS tablet Take 1,000 Units by mouth daily.    . meclizine (ANTIVERT) 50 MG tablet Take 0.5 tablets (25 mg total) by mouth 3 (three) times daily as needed. 30 tablet 0  . Multiple Vitamin (MULTIVITAMIN WITH MINERALS) TABS tablet Take 1 tablet by mouth daily.    . Omega-3 Fatty Acids (FISH OIL) 1000 MG CAPS Take one by mouth daily    . pravastatin (PRAVACHOL) 10 MG tablet TAKE 1 TABLET (10 MG TOTAL) BY MOUTH DAILY. 30 tablet 5  . terbinafine (LAMISIL) 250 MG tablet TAKE 1 TABLET BY MOUTH DAILY. 30 tablet 3   No current facility-administered medications on file prior to visit.    Allergies  Allergen Reactions  . Ace Inhibitors     cough    Past Medical History  Diagnosis Date  . Anemia   . Hypertension     Past Surgical History  Procedure Laterality Date  . Tubal ligation      Family  History  Problem Relation Age of Onset  . Hyperlipidemia Mother   . Hypertension Mother   . Hyperlipidemia Father     Social History   Social History  . Marital Status: Married    Spouse Name: N/A  . Number of Children: N/A  . Years of Education: N/A   Occupational History  . Not on file.   Social History Main Topics  . Smoking status: Never Smoker   . Smokeless tobacco: Not on file  . Alcohol Use: No  . Drug Use: No  . Sexual Activity: Not on file   Other Topics Concern  . Not on file   Social History Narrative   From [place].   Married, 2 sons ages 30 and 67 yo.   Works as Quarry manager at [employer].   The PMH, [place], Social History, Family History, Medications, and allergies have been reviewed in [facility], and have been updated if relevant.   Review of Systems  Constitutional: Negative.   HENT: Negative.   Respiratory: Negative.   Cardiovascular: Negative.   Gastrointestinal: Negative.   Musculoskeletal: Negative.   Skin: Negative.   Neurological: Negative.   Psychiatric/Behavioral: Negative.   All other systems reviewed and are negative.      Objective:    BP  122/72 mmHg  Pulse 59  Temp(Src) 98.4 F (36.9 C) (Oral)  Wt 134 lb 8 oz (61.009 kg)  SpO2 99%   Physical Exam  Constitutional: She is oriented to person, place, and time. She appears well-developed and well-nourished. No distress.  HENT:  Head: Normocephalic and atraumatic.  Eyes: Conjunctivae are normal.  Neck: Normal range of motion.  Cardiovascular: Normal rate and regular rhythm.   Pulmonary/Chest: Effort normal and breath sounds normal. No respiratory distress. She has no wheezes.  Musculoskeletal: She exhibits no edema.  Neurological: She is alert and oriented to person, place, and time. No cranial nerve deficit.  Skin: Skin is warm and dry.  Psychiatric: She has a normal mood and affect. Her behavior is normal. Judgment and thought content normal.  Nursing note and vitals  reviewed.         Assessment & Plan:   Essential hypertension No Follow-up on file.

## 2015-06-24 NOTE — Assessment & Plan Note (Signed)
Well controlled on current dose of norvasc. No changes made today. Call or return to clinic prn if these symptoms worsen or fail to improve as anticipated.

## 2015-06-24 NOTE — Progress Notes (Signed)
Pre visit review using our clinic review tool, if applicable. No additional management support is needed unless otherwise documented below in the visit note. 

## 2015-09-30 ENCOUNTER — Ambulatory Visit (INDEPENDENT_AMBULATORY_CARE_PROVIDER_SITE_OTHER): Payer: 59 | Admitting: Cardiology

## 2015-09-30 ENCOUNTER — Encounter: Payer: Self-pay | Admitting: Cardiology

## 2015-09-30 VITALS — BP 120/84 | HR 48 | Ht 62.0 in | Wt 142.0 lb

## 2015-09-30 DIAGNOSIS — I10 Essential (primary) hypertension: Secondary | ICD-10-CM | POA: Diagnosis not present

## 2015-09-30 DIAGNOSIS — E785 Hyperlipidemia, unspecified: Secondary | ICD-10-CM

## 2015-09-30 DIAGNOSIS — R079 Chest pain, unspecified: Secondary | ICD-10-CM | POA: Diagnosis not present

## 2015-09-30 DIAGNOSIS — R001 Bradycardia, unspecified: Secondary | ICD-10-CM

## 2015-09-30 NOTE — Progress Notes (Signed)
Cardiology Office Note   Date:  09/30/2015   ID:  Joan Morgan, DOB Jan 31, 1961, MRN [id number]  Referring Doctor:  Arnette Norris, MD   Cardiologist:   Wende Bushy, MD   Reason for consultation:  Chief Complaint  Patient presents with  . other    Est. Care C/o occassional chest pain and shoulder blade pain. Meds reviewed verbally with pt.      History of Present Illness: Joan Morgan is a 55 y.o. female who presents for chest pain. Symptoms have been going on for a few months now. She describes chest pain as sharp in the center of her chest up and down from the abdomen. There is also associated pain in the back between the shoulder blades. Symptoms are 4-5 out of 10 in severity. Lasting as long as 30 minutes at the most. Not triggered by anything in particular. Relief by rubbing something warm in the affected areas and also taking deep breaths. Symptoms are nonradiating.  For her hypertension, she reports not taking the amlodipine every day. She works nights, 7 on and 7 off. The times that she works at night, she usually takes her blood pressure medication. When she is off, she doesn't take her medication. She routinely checks her blood pressure and decides to take the blood pressure medication when the she doesn't sleep well during the time she works at night or when the blood pressure is more than 130/80.  She denies LOC, syncope. She experiences dizziness when her blood pressure is less than 120/80. Otherwise no other episodes of dizziness. No fever no cough no colds. She reports some nausea when she has episodes of the chest pain. No diaphoresis. No abdominal pain. No issues with bleeding. No orthopnea, PND, edema.   ROS:  Please see the history of present illness. Aside from mentioned under HPI, all other systems are reviewed and negative.     Past Medical History  Diagnosis Date  . Anemia   . Hypertension   . Hyperlipidemia     Past Surgical History  Procedure  Laterality Date  . Tubal ligation       reports that she has never smoked. She does not have any smokeless tobacco history on file. She reports that she does not drink alcohol or use illicit drugs.   family history includes Hyperlipidemia in her father and mother; Hypertension in her mother. no family history of coronary artery disease.  Current Outpatient Prescriptions  Medication Sig Dispense Refill  . amLODipine (NORVASC) 5 MG tablet Take 1 tablet (5 mg total) by mouth daily. 30 tablet 3  . aspirin 81 MG chewable tablet Chew 81 mg by mouth daily.    . Biotin 5 MG TABS Take by mouth daily.    . cholecalciferol (VITAMIN D) 1000 UNITS tablet Take 1,000 Units by mouth daily.    Marland Kitchen GARLIC PO Take by mouth daily.    . Multiple Vitamin (MULTIVITAMIN WITH MINERALS) TABS tablet Take 1 tablet by mouth daily.    . Omega-3 Fatty Acids (FISH OIL) 1000 MG CAPS Take one by mouth daily     No current facility-administered medications for this visit.   She reports taking amlodipine when necessary. She decides to take it when her blood pressures were more than 130/80.  Allergies: Ace inhibitors    PHYSICAL EXAM: VS:  BP 120/84 mmHg  Pulse 48  Ht 5\' 2"  (1.575 m)  Wt 142 lb (64.411 kg)  BMI 25.97 kg/m2 , Body mass index is  25.97 kg/(m^2). Wt Readings from Last 3 Encounters:  09/30/15 142 lb (64.411 kg)  06/24/15 134 lb 8 oz (61.009 kg)  06/04/15 137 lb 12 oz (62.483 kg)    GENERAL:  well developed, well nourished, not in acute distress HEENT: normocephalic, pink conjunctivae, anicteric sclerae, no xanthelasma, normal dentition, oropharynx clear NECK:  no neck vein engorgement, JVP normal, no hepatojugular reflux, carotid upstroke brisk and symmetric, no bruit, no thyromegaly, no lymphadenopathy LUNGS:  good respiratory effort, clear to auscultation bilaterally CV:  PMI not displaced, no thrills, no lifts, S1 and S2 within normal limits, no palpable S3 or S4, no murmurs, no rubs, no  gallops ABD:  Soft, nontender, nondistended, normoactive bowel sounds, no abdominal aortic bruit, no hepatomegaly, no splenomegaly MS: nontender back, no kyphosis, no scoliosis, no joint deformities EXT:  2+ DP/PT pulses, no edema, no varicosities, no cyanosis, no clubbing SKIN: warm, nondiaphoretic, normal turgor, no ulcers NEUROPSYCH: alert, oriented to person, place, and time, sensory/motor grossly intact, normal mood, appropriate affect  Recent Labs: 10/08/2014: TSH 1.72 12/04/2014: Hemoglobin 13.4; Platelets 344.0 02/12/2015: ALT 18; BUN 10; Creatinine, Ser 0.81; Potassium 3.7; Sodium 143   Lipid Panel    Component Value Date/Time   CHOL 230* 02/12/2015 0737   TRIG 130.0 02/12/2015 0737   HDL 55.10 02/12/2015 0737   CHOLHDL 4 02/12/2015 0737   VLDL 26.0 02/12/2015 0737   LDLCALC 149* 02/12/2015 0737   LDLDIRECT 159.7 10/24/2012 1143     Other studies Reviewed:  EKG:  EKG is ordered today. The ekg ordered today was personally reviewed by me and it reveals sinus bradycardia, 48 BPM. No ischemic EKG changes.  Additional studies/ records that were reviewed personally reviewed by me today include: Not available   ASSESSMENT AND PLAN:  1. Chest pain - atypical. Nonexertional in nature. Her risk factors for coronary artery disease include age, hypertension. She does not have any history of urinary artery disease in the family, no smoking history. For further evaluation, recommend exercise stress echocardiogram. We'll also recommend transthoracic echocardiogram.  2. sinus bradycardia - no associated symptoms of syncope or fatigue, etc. Patient not on chronotropic agent. Recommend echocardiogram. Recommend 24-hour Holter monitor. Recommend check thyroid function tests.  3. Hypertension - BP is well controlled today. Continue monitoring BP. Continue current medical therapy and lifestyle changes. Patient follows up with PCP for this. Recommend to do blood pressure log.  Current  medicines are reviewed at length with the patient today.  The patient does not have concerns regarding medicines.  Labs/ tests ordered today include:  Orders Placed This Encounter  Procedures  . TSH  . T4, free  . Holter monitor - 24 hour  . EKG 12-Lead  . Echocardiogram  . Echo stress    I had a lengthy and detailed discussion with the patient regarding diagnoses, prognosis, diagnostic options, treatment options, and side effects of medications.   I counseled the patient on importance of lifestyle modification including heart healthy diet, regular physical activity.   Disposition:   FU with undersigned after tests   Signed, Wende Bushy, MD  09/30/2015 9:38 AM    [facility]

## 2015-09-30 NOTE — Patient Instructions (Addendum)
Medication Instructions:  Your physician recommends that you continue on your current medications as directed. Please refer to the Current Medication list given to you today.   Labwork: Labs for today are TSH & Free T 4   Testing/Procedures:  Your physician has requested that you have an echocardiogram. Echocardiography is a painless test that uses sound waves to create images of your heart. It provides your doctor with information about the size and shape of your heart and how well your heart's chambers and valves are working. This procedure takes approximately one hour. There are no restrictions for this procedure.  Date & Time:_______________________________________  Your physician has requested that you have a stress echocardiogram. For further information please visit [email or web address]. Please follow instruction sheet as given.  Date & Time: ______________________________________   Your physician has recommended that you wear a holter monitor. Holter monitors are medical devices that record the heart's electrical activity. Doctors most often use these monitors to diagnose arrhythmias. Arrhythmias are problems with the speed or rhythm of the heartbeat. The monitor is a small, portable device. You can wear one while you do your normal daily activities. This is usually used to diagnose what is causing palpitations/syncope (passing out).  Date & Time: ___________________________________________   Follow-Up: Your physician recommends that you schedule a follow-up appointment after testing.  Date & Time: ________________________________________   Any Other Special Instructions Will Be Listed Below (If Applicable).  How to prepare for your Stress Echo test:  1. No caffeine for 24 hours prior to test 2. No smoking 24 hours prior to test. 3. Your medication may be taken with water.  If your doctor stopped a medication because of this test, do not take that medication. 4. Ladies,  please do not wear dresses.  Skirts or pants are appropriate. Please wear a short sleeve shirt. 5. No perfume, cologne or lotion. 6. Wear comfortable walking shoes. No heels!              If you need a refill on your cardiac medications before your next appointment, please call your pharmacy.    Exercise Stress Echocardiogram An exercise stress echocardiogram is a heart (cardiac) test used to check the function of your heart. This test may also be called an exercise stress echocardiography or stress echo. This stress test will check how well your heart muscle and valves are working and determine if your heart muscle is getting enough blood. You will exercise on a treadmill to naturally increase or stress the functioning of your heart.  An echocardiogram uses sound waves (ultrasound) to produce an image of your heart. If your heart does not work normally, it may indicate coronary artery disease with poor coronary blood supply. The coronary arteries are the arteries that bring blood and oxygen to your heart. LET [facility] CARE PROVIDER KNOW ABOUT:  Any allergies you have.  All medicines you are taking, including vitamins, herbs, eye drops, creams, and over-the-counter medicines.  Previous problems you or members of your family have had with the use of anesthetics.  Any blood disorders you have.  Previous surgeries you have had.  Medical conditions you have.  Possibility of pregnancy, if this applies. RISKS AND COMPLICATIONS Generally, this is a safe procedure. However, as with any procedure, complications can occur. Possible complications can include:  You develop pain or pressure in the following areas:  Chest.  Jaw or neck.  Between your shoulder blades.  Radiating down your left arm.  Dizziness or lightheadedness.  Shortness of breath.  Increased or irregular heartbeat.  Nausea or vomiting.  Heart attack (rare). BEFORE THE PROCEDURE 7. Avoid all forms of  caffeine for 24 hours before your test or as directed by your health care provider. This includes coffee, tea (even decaffeinated tea), caffeinated sodas, chocolate, cocoa, and certain pain medicines. 8. Follow your health care provider's instructions regarding eating and drinking before the test. 9. Take your medicines as directed at regular times with water unless instructed otherwise. Exceptions may include: 1. If you have diabetes, ask how you are to take your insulin or pills. It is common to adjust insulin dosing the morning of the test. 2. If you are taking beta-blocker medicines, it is important to talk to your health care provider about these medicines well before the date of your test. Taking beta-blocker medicines may interfere with the test. In some cases, these medicines need to be changed or stopped 24 hours or more before the test. 3. If you wear a nitroglycerin patch, it may need to be removed prior to the test. Ask your health care provider if the patch should be removed before the test. 10. If you use an inhaler for any breathing condition, bring it with you to the test. 11. If you are an outpatient, bring a snack so you can eat right after the stress phase of the test. 12. Do not smoke for 4 hours prior to the test or as directed by your health care provider. 13. Wear loose-fitting clothes and comfortable shoes for the test. This test involves walking on a treadmill. PROCEDURE   Multiple electrodes will be put on your chest. If needed, small areas of your chest may be shaved to get better contact with the electrodes. Once the electrodes are attached to your body, multiple wires will be attached to the electrodes, and your heart rate will be monitored.  You will have an echocardiogram done at rest.  To produce this image of your heart, gel is applied to your chest, and a wand-like tool (transducer) is moved over the chest. The transducer sends the sound waves through the chest to  create the moving images of your heart.  You may need an IV to receive a medication that improves the quality of the pictures.  You will then walk on a treadmill. The treadmill will be started at a slow pace. The treadmill speed and incline will gradually be increased to raise your heart rate.  At the peak of exercise, the treadmill will be stopped. You will lie down immediately on a bed so that a second echocardiogram can be done to visualize your heart's motion with exercise.  The test usually takes 30-60 minutes to complete. AFTER THE PROCEDURE  Your heart rate and blood pressure will be monitored after the test.  You may return to your normal schedule, including diet, activities, and medicines, unless your health care provider tells you otherwise.   This information is not intended to replace advice given to you by your health care provider. Make sure you discuss any questions you have with your health care provider.   Document Released: 07/22/2004 Document Revised: 07/23/2013 Document Reviewed: 03/25/2013 Elsevier Interactive Patient Education 2016 [employer].     Echocardiogram An echocardiogram, or echocardiography, uses sound waves (ultrasound) to produce an image of your heart. The echocardiogram is simple, painless, obtained within a short period of time, and offers valuable information to your health care provider. The images from an echocardiogram can provide information such  as:  Evidence of coronary artery disease (CAD).  Heart size.  Heart muscle function.  Heart valve function.  Aneurysm detection.  Evidence of a past heart attack.  Fluid buildup around the heart.  Heart muscle thickening.  Assess heart valve function. LET [facility] CARE PROVIDER KNOW ABOUT:  Any allergies you have.  All medicines you are taking, including vitamins, herbs, eye drops, creams, and over-the-counter medicines.  Previous problems you or members of your family have had  with the use of anesthetics.  Any blood disorders you have.  Previous surgeries you have had.  Medical conditions you have.  Possibility of pregnancy, if this applies. BEFORE THE PROCEDURE  No special preparation is needed. Eat and drink normally.  PROCEDURE  14. In order to produce an image of your heart, gel will be applied to your chest and a wand-like tool (transducer) will be moved over your chest. The gel will help transmit the sound waves from the transducer. The sound waves will harmlessly bounce off your heart to allow the heart images to be captured in real-time motion. These images will then be recorded. 15. You may need an IV to receive a medicine that improves the quality of the pictures. AFTER THE PROCEDURE You may return to your normal schedule including diet, activities, and medicines, unless your health care provider tells you otherwise.   This information is not intended to replace advice given to you by your health care provider. Make sure you discuss any questions you have with your health care provider.   Document Released: 07/15/2000 Document Revised: 08/08/2014 Document Reviewed: 03/25/2013 Elsevier Interactive Patient Education 2016 [employer]    Holter Monitoring A Holter monitor is a small device that is used to detect abnormal heart rhythms. It clips to your clothing and is connected by wires to flat, sticky disks (electrodes) that attach to your chest. It is worn continuously for 24-48 hours. HOME CARE INSTRUCTIONS  Wear your Holter monitor at all times, even while exercising and sleeping, for as long as directed by your health care provider.  Make sure that the Holter monitor is safely clipped to your clothing or close to your body as recommended by your health care provider.  Do not get the monitor or wires wet.  Do not put body lotion or moisturizer on your chest.  Keep your skin clean.  Keep a diary of your daily activities, such as walking  and doing chores. If you feel that your heartbeat is abnormal or that your heart is fluttering or skipping a beat:  Record what you are doing when it happens.  Record what time of day the symptoms occur.  Return your Holter monitor as directed by your health care provider.  Keep all follow-up visits as directed by your health care provider. This is important. SEEK IMMEDIATE MEDICAL CARE IF:  You feel lightheaded or you faint.  You have trouble breathing.  You feel pain in your chest, upper arm, or jaw.  You feel sick to your stomach and your skin is pale, cool, or damp.  You heartbeat feels unusual or abnormal.   This information is not intended to replace advice given to you by your health care provider. Make sure you discuss any questions you have with your health care provider.   Document Released: 04/15/2004 Document Revised: 08/08/2014 Document Reviewed: 02/24/2014 Elsevier Interactive Patient Education [employer].

## 2015-10-01 ENCOUNTER — Telehealth: Payer: Self-pay | Admitting: *Deleted

## 2015-10-01 LAB — T4, FREE: FREE T4: 1.43 ng/dL (ref 0.82–1.77)

## 2015-10-01 LAB — TSH: TSH: 1.88 u[IU]/mL (ref 0.450–4.500)

## 2015-10-01 NOTE — Telephone Encounter (Signed)
Preliminary results given to patient for TSH and Free T4. Let her know they were within normal limits and that we would call with other results as they become available. She had no other questions at this time.

## 2015-10-21 ENCOUNTER — Telehealth: Payer: Self-pay

## 2015-10-21 NOTE — Telephone Encounter (Signed)
Reviewed instructions for pt's [facility] ECHO sched for tomorrow. Pt is concerned that she will not be able to perform the treadmill portion of the test, as she has knee issues and does not exercise regularly. After some discussion and detailed explanation of the test, she states that she is willing to attempt it and will keep her appt tomorrow. Advised her that if she does not feel comfortable, I can make Dr. Yvone Neu aware and see if there are alternative tests, but she insists that she would like to make an attempt.

## 2015-10-22 ENCOUNTER — Ambulatory Visit (INDEPENDENT_AMBULATORY_CARE_PROVIDER_SITE_OTHER): Payer: 59

## 2015-10-22 DIAGNOSIS — R079 Chest pain, unspecified: Secondary | ICD-10-CM | POA: Diagnosis not present

## 2015-10-23 LAB — ECHOCARDIOGRAM STRESS TEST
CHL CUP STRESS STAGE 1 HR: 62 {beats}/min
CHL CUP STRESS STAGE 2 DBP: 100 mmHg
CHL CUP STRESS STAGE 3 SPEED: 0 mph
CHL CUP STRESS STAGE 4 GRADE: 10 %
CHL CUP STRESS STAGE 4 SPEED: 1.7 mph
CHL CUP STRESS STAGE 5 DBP: 109 mmHg
CHL CUP STRESS STAGE 5 GRADE: 12 %
CHL CUP STRESS STAGE 5 SBP: 202 mmHg
CHL CUP STRESS STAGE 6 GRADE: 14 %
CHL CUP STRESS STAGE 6 HR: 171 {beats}/min
CHL CUP STRESS STAGE 6 SPEED: 3.4 mph
CHL CUP STRESS STAGE 7 GRADE: 16 %
CHL CUP STRESS STAGE 7 HR: 129 {beats}/min
CHL CUP STRESS STAGE 7 SPEED: 4.2 mph
CHL CUP STRESS STAGE 8 DBP: 87 mmHg
CHL CUP STRESS STAGE 8 GRADE: 0 %
CHL CUP STRESS STAGE 9 DBP: 97 mmHg
CHL CUP STRESS STAGE 9 GRADE: 0 %
CHL CUP STRESS STAGE 9 HR: 81 {beats}/min
CSEPEDS: 8 s
CSEPHR: 101 %
Estimated workload: 11.9 METS
Exercise duration (min): 40 min
MPHR: 164 {beats}/min
Peak HR: 129 {beats}/min
Percent of predicted max HR: 78 %
Rest HR: 62 {beats}/min
Stage 2 Grade: 0 %
Stage 2 HR: 76 {beats}/min
Stage 2 SBP: 151 mmHg
Stage 2 Speed: 0 mph
Stage 3 Grade: 0.1 %
Stage 3 HR: 76 {beats}/min
Stage 4 HR: 86 {beats}/min
Stage 5 HR: 110 {beats}/min
Stage 5 Speed: 2.5 mph
Stage 8 HR: 141 {beats}/min
Stage 8 SBP: 169 mmHg
Stage 8 Speed: 0 mph
Stage 9 SBP: 169 mmHg
Stage 9 Speed: 0 mph

## 2015-10-26 ENCOUNTER — Other Ambulatory Visit: Payer: Self-pay

## 2015-10-26 ENCOUNTER — Ambulatory Visit (INDEPENDENT_AMBULATORY_CARE_PROVIDER_SITE_OTHER): Payer: 59

## 2015-10-26 DIAGNOSIS — R001 Bradycardia, unspecified: Secondary | ICD-10-CM | POA: Diagnosis not present

## 2015-10-26 DIAGNOSIS — R079 Chest pain, unspecified: Secondary | ICD-10-CM

## 2015-10-29 ENCOUNTER — Ambulatory Visit
Admission: RE | Admit: 2015-10-29 | Discharge: 2015-10-29 | Disposition: A | Discharge status: Home / Self Care | Payer: 59 | Source: Ambulatory Visit | Attending: Cardiology | Admitting: Cardiology

## 2015-10-29 DIAGNOSIS — R001 Bradycardia, unspecified: Secondary | ICD-10-CM | POA: Diagnosis not present

## 2015-10-29 DIAGNOSIS — R079 Chest pain, unspecified: Secondary | ICD-10-CM | POA: Insufficient documentation

## 2015-10-30 ENCOUNTER — Other Ambulatory Visit: Payer: Self-pay | Admitting: Family Medicine

## 2015-10-30 DIAGNOSIS — Z01419 Encounter for gynecological examination (general) (routine) without abnormal findings: Secondary | ICD-10-CM

## 2015-10-30 DIAGNOSIS — E785 Hyperlipidemia, unspecified: Secondary | ICD-10-CM

## 2015-10-30 DIAGNOSIS — I10 Essential (primary) hypertension: Secondary | ICD-10-CM

## 2015-11-04 ENCOUNTER — Other Ambulatory Visit (INDEPENDENT_AMBULATORY_CARE_PROVIDER_SITE_OTHER): Payer: 59

## 2015-11-04 DIAGNOSIS — Z0000 Encounter for general adult medical examination without abnormal findings: Secondary | ICD-10-CM

## 2015-11-04 DIAGNOSIS — Z01419 Encounter for gynecological examination (general) (routine) without abnormal findings: Secondary | ICD-10-CM

## 2015-11-04 LAB — CBC WITH DIFFERENTIAL/PLATELET
BASOS ABS: 0 10*3/uL (ref 0.0–0.1)
BASOS PCT: 0.6 % (ref 0.0–3.0)
EOS ABS: 0.1 10*3/uL (ref 0.0–0.7)
EOS PCT: 1.6 % (ref 0.0–5.0)
HCT: 39.6 % (ref 36.0–46.0)
Hemoglobin: 13.3 g/dL (ref 12.0–15.0)
LYMPHS ABS: 2.5 10*3/uL (ref 0.7–4.0)
LYMPHS PCT: 34.9 % (ref 12.0–46.0)
MCHC: 33.7 g/dL (ref 30.0–36.0)
MCV: 85.5 fl (ref 78.0–100.0)
Monocytes Absolute: 0.4 10*3/uL (ref 0.1–1.0)
Monocytes Relative: 5.4 % (ref 3.0–12.0)
NEUTROS ABS: 4 10*3/uL (ref 1.4–7.7)
NEUTROS PCT: 57.5 % (ref 43.0–77.0)
PLATELETS: 317 10*3/uL (ref 150.0–400.0)
RBC: 4.63 Mil/uL (ref 3.87–5.11)
RDW: 13.3 % (ref 11.5–15.5)
WBC: 7 10*3/uL (ref 4.0–10.5)

## 2015-11-04 LAB — COMPREHENSIVE METABOLIC PANEL
ALT: 19 U/L (ref 0–35)
AST: 18 U/L (ref 0–37)
Albumin: 4.4 g/dL (ref 3.5–5.2)
Alkaline Phosphatase: 63 U/L (ref 39–117)
BILIRUBIN TOTAL: 0.4 mg/dL (ref 0.2–1.2)
BUN: 12 mg/dL (ref 6–23)
CALCIUM: 9.8 mg/dL (ref 8.4–10.5)
CHLORIDE: 102 meq/L (ref 96–112)
CO2: 33 meq/L — AB (ref 19–32)
Creatinine, Ser: 0.72 mg/dL (ref 0.40–1.20)
GFR: 89 mL/min (ref >60.00)
GLUCOSE: 91 mg/dL (ref 70–99)
Potassium: 3.6 mEq/L (ref 3.5–5.1)
Sodium: 141 mEq/L (ref 135–145)
Total Protein: 8 g/dL (ref 6.0–8.3)

## 2015-11-04 LAB — LIPID PANEL
CHOL/HDL RATIO: 4
Cholesterol: 252 mg/dL — ABNORMAL HIGH (ref 0–200)
HDL: 65.3 mg/dL (ref >39.00)
LDL CALC: 169 mg/dL — AB (ref 0–99)
NONHDL: 186.98
TRIGLYCERIDES: 92 mg/dL (ref 0.0–149.0)
VLDL: 18.4 mg/dL (ref 0.0–40.0)

## 2015-11-05 ENCOUNTER — Encounter: Payer: Self-pay | Admitting: Cardiology

## 2015-11-05 ENCOUNTER — Ambulatory Visit (INDEPENDENT_AMBULATORY_CARE_PROVIDER_SITE_OTHER): Payer: 59 | Admitting: Cardiology

## 2015-11-05 VITALS — BP 130/82 | HR 64 | Ht 62.0 in | Wt 139.0 lb

## 2015-11-05 DIAGNOSIS — I10 Essential (primary) hypertension: Secondary | ICD-10-CM

## 2015-11-05 DIAGNOSIS — R079 Chest pain, unspecified: Secondary | ICD-10-CM | POA: Diagnosis not present

## 2015-11-05 DIAGNOSIS — R001 Bradycardia, unspecified: Secondary | ICD-10-CM | POA: Diagnosis not present

## 2015-11-05 NOTE — Patient Instructions (Signed)
Medication Instructions:  Your physician recommends that you continue on your current medications as directed. Please refer to the Current Medication list given to you today.  Labwork: None ordered.  Testing/Procedures: None ordered.  Follow-Up: Your physician recommends that you schedule a follow-up appointment as needed.   Any Other Special Instructions Will Be Listed Below (If Applicable).     If you need a refill on your cardiac medications before your next appointment, please call your pharmacy.   

## 2015-11-05 NOTE — Progress Notes (Signed)
Cardiology Office Note   Date:  11/05/2015   ID:  Joan Morgan, DOB 05/10/1961, MRN [id number]  Referring Doctor:  Arnette Norris, MD   Cardiologist:   Wende Bushy, MD   Reason for consultation:  Chief Complaint  Patient presents with  . other    F/u holter and stress echo. Meds reviewed verbally with pt.      History of Present Illness: Joan Morgan is a 55 y.o. female who presents for Follow-up to discuss results of tests.  She denies LOC, syncope. She experiences dizziness when her blood pressure is less than 120/80. Otherwise no other episodes of dizziness. No fever no cough no colds.  No diaphoresis. No abdominal pain. No issues with bleeding. No orthopnea, PND, edema.   ROS:  Please see the history of present illness. Aside from mentioned under HPI, all other systems are reviewed and negative.     Past Medical History  Diagnosis Date  . Anemia   . Hypertension   . Hyperlipidemia     Past Surgical History  Procedure Laterality Date  . Tubal ligation       reports that she has never smoked. She does not have any smokeless tobacco history on file. She reports that she does not drink alcohol or use illicit drugs.   family history includes Hyperlipidemia in her father and mother; Hypertension in her mother. no family history of coronary artery disease.  Current Outpatient Prescriptions  Medication Sig Dispense Refill  . amLODipine (NORVASC) 5 MG tablet Take 1 tablet (5 mg total) by mouth daily. 30 tablet 3  . aspirin 81 MG chewable tablet Chew 81 mg by mouth daily.    . Biotin 5 MG TABS Take by mouth daily.    . cholecalciferol (VITAMIN D) 1000 UNITS tablet Take 1,000 Units by mouth daily.    Marland Kitchen GARLIC PO Take by mouth daily.    . Multiple Vitamin (MULTIVITAMIN WITH MINERALS) TABS tablet Take 1 tablet by mouth daily.    . Omega-3 Fatty Acids (FISH OIL) 1000 MG CAPS Take one by mouth daily     No current facility-administered medications for this  visit.   She reports taking amlodipine when necessary. She decides to take it when her blood pressures were more than 130/80.  Allergies: Ace inhibitors    PHYSICAL EXAM: VS:  BP 130/82 mmHg  Pulse 64  Ht 5\' 2"  (1.575 m)  Wt 139 lb (63.05 kg)  BMI 25.42 kg/m2 , Body mass index is 25.42 kg/(m^2). Wt Readings from Last 3 Encounters:  11/05/15 139 lb (63.05 kg)  09/30/15 142 lb (64.411 kg)  06/24/15 134 lb 8 oz (61.009 kg)    GENERAL:  well developed, well nourished, not in acute distress HEENT: normocephalic, pink conjunctivae, anicteric sclerae, no xanthelasma, normal dentition, oropharynx clear NECK:  no neck vein engorgement, JVP normal, no hepatojugular reflux, carotid upstroke brisk and symmetric, no bruit, no thyromegaly, no lymphadenopathy LUNGS:  good respiratory effort, clear to auscultation bilaterally CV:  PMI not displaced, no thrills, no lifts, S1 and S2 within normal limits, no palpable S3 or S4, no murmurs, no rubs, no gallops ABD:  Soft, nontender, nondistended, normoactive bowel sounds, no abdominal aortic bruit, no hepatomegaly, no splenomegaly MS: nontender back, no kyphosis, no scoliosis, no joint deformities EXT:  2+ DP/PT pulses, no edema, no varicosities, no cyanosis, no clubbing SKIN: warm, nondiaphoretic, normal turgor, no ulcers NEUROPSYCH: alert, oriented to person, place, and time, sensory/motor grossly intact, normal mood, appropriate affect  Recent Labs: 09/30/2015: TSH 1.880 11/04/2015: ALT 19; BUN 12; Creatinine, Ser 0.72; Hemoglobin 13.3; Platelets 317.0; Potassium 3.6; Sodium 141     Other studies Reviewed:  EKG:   The ekg From 09/30/2015 was personally reviewed by me and it revealed sinus bradycardia, 48 BPM. No ischemic EKG changes.  Additional studies/ records that were reviewed personally reviewed by me today include:   Stress echo 10/22/2015: Stress ECG conclusions: There were no stress arrhythmias or  conduction abnormalities. The stress  ECG was normal. - Staged echo: Normal echo stress - Good exercise tolerance, 11.9 METS achieved, target heart rate  achieved.  Resting EF 55%, peak stress EF 65 to 70%  No significant wall motion abnormalities noted at peak stress or  in recovery.  Impressions:  - Normal study after maximal exercise.  Echo 10/26/2015: Left ventricle: The cavity size was normal. Systolic function was  normal. The estimated ejection fraction was in the range of 60%  to 65%. Wall motion was normal; there were no regional wall  motion abnormalities. Left ventricular diastolic function  parameters were normal. - Left atrium: The atrium was normal in size. - Right ventricle: Systolic function was normal. - Pulmonary arteries: Systolic pressure was within the normal  range.  Impressions:  - Normal study.   Holter 10/26/2015:  Sinus rhythm  Sinus bradycardia 46% of the total number of beats, minimum heart rate of 48 BPM. No symptom recorded.  No high grade supraventricular or ventricular ectopy   ASSESSMENT AND PLAN:  Chest pain - atypical. Nonexertional in nature. Her risk factors for coronary artery disease include age, hypertension. She does not have any history of coronary artery disease in the family, no smoking history.  The results of the echocardiogram and stress echocardiogram were discussed in detail with the patient. Stress echocardiogram from 10/26/2015 was very reassuring. Patient was able to achieve 11.9 METs, and reached 101% maximum predicted heart rate. No evidence of wall motion abnormality on the echocardiogram. Discussed with the patient that a normal stress echocardiogram hypertensive very low risk of clinically significant CAD. Patient reassured. Echocardiogram from 10/26/2015 was also reassuring, overall normal results.   sinus bradycardia - no associated symptoms of syncope or fatigue, etc. TSH was within normal limits. Echocardiogram from 10/26/2015 showed  normal heart function. Holter from 10/26/2015 showed sinus rhythm, with 46% of total number of beats in bradycardia. However minimal heart rate was just 48 BPM. No symptoms associated with the bradycardia. Results of this Holter monitor was discussed in detail with the patient. Also, reviewed stress echocardiogram findings with the patient. It was pointed out that her heart rate did go up as expected with exercise, actually up to 101% of maximum predicted heart rate. She did not have any symptoms during the stress test. All these things are reassuring. There is no indication for further workup of this bradycardia at this point. No indication for pacemaker placement at this point. Patient advised to continue to monitor symptoms.  Hypertension - BP is well controlled today. Continue monitoring BP. Continue current medical therapy and lifestyle changes. Patient follows up with PCP for this. Recommend to do blood pressure log.  Patient says that she will be following up with her PCP.   Current medicines are reviewed at length with the patient today.  The patient does not have concerns regarding medicines.  Labs/ tests ordered today include:  No orders of the defined types were placed in this encounter.    I had a lengthy and detailed  discussion with the patient regarding diagnoses, prognosis, diagnostic options, treatment options, and side effects of medications.   I counseled the patient on importance of lifestyle modification including heart healthy diet, regular physical activity.   Disposition:   FU when necessary   I spent at least 25 minutes with the patient today and more than 50% of the time was spent counseling the patient and coordinating care.     Signed, Wende Bushy, MD  11/05/2015 10:18 AM    Page

## 2015-11-06 ENCOUNTER — Encounter: Payer: Self-pay | Admitting: *Deleted

## 2015-11-11 ENCOUNTER — Encounter: Payer: 59 | Admitting: Family Medicine

## 2015-11-18 ENCOUNTER — Ambulatory Visit (INDEPENDENT_AMBULATORY_CARE_PROVIDER_SITE_OTHER): Payer: 59 | Admitting: Family Medicine

## 2015-11-18 ENCOUNTER — Encounter: Payer: Self-pay | Admitting: Family Medicine

## 2015-11-18 VITALS — BP 122/88 | HR 49 | Temp 98.0°F | Ht 62.0 in | Wt 137.8 lb

## 2015-11-18 DIAGNOSIS — D649 Anemia, unspecified: Secondary | ICD-10-CM | POA: Diagnosis not present

## 2015-11-18 DIAGNOSIS — I10 Essential (primary) hypertension: Secondary | ICD-10-CM | POA: Diagnosis not present

## 2015-11-18 DIAGNOSIS — E785 Hyperlipidemia, unspecified: Secondary | ICD-10-CM

## 2015-11-18 DIAGNOSIS — Z01419 Encounter for gynecological examination (general) (routine) without abnormal findings: Secondary | ICD-10-CM

## 2015-11-18 DIAGNOSIS — Z0000 Encounter for general adult medical examination without abnormal findings: Secondary | ICD-10-CM | POA: Diagnosis not present

## 2015-11-18 MED ORDER — PRAVASTATIN SODIUM 10 MG PO TABS: 10.0000 mg | ORAL_TABLET | Freq: Every day | ORAL | Status: DC

## 2015-11-18 NOTE — Patient Instructions (Signed)
Good to see you.  Try over the counter gas-x (four times a day when necessary) or beano for bloating.  Peppermint oil can sometimes help with symptoms as well.  Trial of Align (probiotic) bloating/IBS symptoms (OTC).

## 2015-11-18 NOTE — Progress Notes (Signed)
Pre visit review using our clinic review tool, if applicable. No additional management support is needed unless otherwise documented below in the visit note. 

## 2015-11-18 NOTE — Assessment & Plan Note (Signed)
Reviewed preventive care protocols, scheduled due services, and updated immunizations Discussed nutrition, exercise, diet, and healthy lifestyle.  Pt to call to schedule mammogram. 

## 2015-11-18 NOTE — Progress Notes (Signed)
Subjective:   Patient ID: Joan Morgan, female    DOB: 31-May-1961, 55 y.o.   MRN: [id number]  Joan Morgan is a pleasant 55 y.o. year old female who presents to clinic today with Annual Exam  on 11/18/2015  HPI:  Mammogram 10/14/13 Colonoscopy 2013 Last pap smear done by me on 10/14/14.  No h/o abnormal pap smears.  HTN- BP now well controlled on amlodipine 5 mgm daily.  ACEI caused cough. Lab Results  Component Value Date   CREATININE 0.72 11/04/2015   HLD- deteriorated.  Took pravachol in past but felt it caused increased thirst and fatigue.  When she saw her labs online last week, she did restart it.  Lab Results  Component Value Date   CHOL 252* 11/04/2015   HDL 65.30 11/04/2015   LDLCALC 169* 11/04/2015   LDLDIRECT 159.7 10/24/2012   TRIG 92.0 11/04/2015   CHOLHDL 4 11/04/2015   Lab Results  Component Value Date   TSH 1.880 09/30/2015   Lab Results  Component Value Date   WBC 7.0 11/04/2015   HGB 13.3 11/04/2015   HCT 39.6 11/04/2015   MCV 85.5 11/04/2015   PLT 317.0 11/04/2015   Current Outpatient Prescriptions on File Prior to Visit  Medication Sig Dispense Refill  . amLODipine (NORVASC) 5 MG tablet Take 1 tablet (5 mg total) by mouth daily. 30 tablet 3  . aspirin 81 MG chewable tablet Chew 81 mg by mouth daily.    . Biotin 5 MG TABS Take by mouth daily.    . cholecalciferol (VITAMIN D) 1000 UNITS tablet Take 1,000 Units by mouth daily.    Marland Kitchen GARLIC PO Take by mouth daily.    . Multiple Vitamin (MULTIVITAMIN WITH MINERALS) TABS tablet Take 1 tablet by mouth daily.    . Omega-3 Fatty Acids (FISH OIL) 1000 MG CAPS Take one by mouth daily     No current facility-administered medications on file prior to visit.    Allergies  Allergen Reactions  . Ace Inhibitors     cough    Past Medical History  Diagnosis Date  . Anemia   . Hypertension   . Hyperlipidemia     Past Surgical History  Procedure Laterality Date  . Tubal ligation       Family History  Problem Relation Age of Onset  . Hyperlipidemia Mother   . Hypertension Mother   . Hyperlipidemia Father     Social History   Social History  . Marital Status: Married    Spouse Name: N/A  . Number of Children: N/A  . Years of Education: N/A   Occupational History  . Not on file.   Social History Main Topics  . Smoking status: Never Smoker   . Smokeless tobacco: Not on file  . Alcohol Use: No  . Drug Use: No  . Sexual Activity: Not on file   Other Topics Concern  . Not on file   Social History Narrative   From [place].   Married, 2 sons ages 11 and 71 yo.   Works as Quarry manager at [employer].   The PMH, [place], Social History, Family History, Medications, and allergies have been reviewed in [facility], and have been updated if relevant.   Review of Systems  Constitutional: Negative.   HENT: Negative.   Cardiovascular: Negative.   Gastrointestinal: Negative.   Endocrine: Negative.   Genitourinary: Negative.   Musculoskeletal: Negative.   Skin: Negative.   Allergic/Immunologic: Negative.   Neurological: Negative.   Hematological:  Negative.   Psychiatric/Behavioral: Negative.   All other systems reviewed and are negative.      Objective:    BP 122/88 mmHg  Pulse 49  Temp(Src) 98 F (36.7 C) (Oral)  Ht 5\' 2"  (1.575 m)  Wt 137 lb 12 oz (62.483 kg)  BMI 25.19 kg/m2  SpO2 98%   Physical Exam   General:  Well-developed,well-nourished,in no acute distress; alert,appropriate and cooperative throughout examination Head:  normocephalic and atraumatic.   Eyes:  vision grossly intact, pupils equal, pupils round, and pupils reactive to light.   Ears:  R ear normal and L ear normal.   Nose:  no external deformity.   Mouth:  good dentition.   Neck:  No deformities, masses, or tenderness noted. Breasts:  No mass, nodules, thickening, tenderness, bulging, retraction, inflamation, nipple discharge or skin changes noted.   Lungs:  Normal  respiratory effort, chest expands symmetrically. Lungs are clear to auscultation, no crackles or wheezes. Heart:  Normal rate and regular rhythm. S1 and S2 normal without gallop, murmur, click, rub or other extra sounds. Abdomen:  Bowel sounds positive,abdomen soft and non-tender without masses, organomegaly or hernias noted. Msk:  No deformity or scoliosis noted of thoracic or lumbar spine.   Extremities:  No clubbing, cyanosis, edema, or deformity noted with normal full range of motion of all joints.   Neurologic:  alert & oriented X3 and gait normal.   Skin:  Intact without suspicious lesions or rashes Cervical Nodes:  No lymphadenopathy noted Axillary Nodes:  No palpable lymphadenopathy Psych:  Cognition and judgment appear intact. Alert and cooperative with normal attention span and concentration. No apparent delusions, illusions, hallucinations       Assessment & Plan:   Essential hypertension  HLD (hyperlipidemia)  Anemia, unspecified anemia type  Well woman exam No Follow-up on file.

## 2015-11-18 NOTE — Assessment & Plan Note (Signed)
Deteriorated but she has since restarted pravachol.

## 2015-11-18 NOTE — Addendum Note (Signed)
Addended by: Carter Kitten on: 11/18/2015 02:24 PM   Modules accepted: Orders, Medications

## 2015-12-08 ENCOUNTER — Other Ambulatory Visit: Payer: Self-pay | Admitting: Family Medicine

## 2016-01-11 ENCOUNTER — Encounter: Payer: Self-pay | Admitting: Family Medicine

## 2016-01-28 DIAGNOSIS — H524 Presbyopia: Secondary | ICD-10-CM | POA: Diagnosis not present

## 2016-02-10 DIAGNOSIS — L603 Nail dystrophy: Secondary | ICD-10-CM | POA: Diagnosis not present

## 2016-02-10 DIAGNOSIS — L659 Nonscarring hair loss, unspecified: Secondary | ICD-10-CM | POA: Diagnosis not present

## 2016-02-10 DIAGNOSIS — B351 Tinea unguium: Secondary | ICD-10-CM | POA: Diagnosis not present

## 2016-07-27 ENCOUNTER — Other Ambulatory Visit: Payer: Self-pay | Admitting: Family Medicine

## 2016-08-03 DIAGNOSIS — B351 Tinea unguium: Secondary | ICD-10-CM | POA: Diagnosis not present

## 2016-08-03 DIAGNOSIS — D229 Melanocytic nevi, unspecified: Secondary | ICD-10-CM | POA: Diagnosis not present

## 2016-08-03 DIAGNOSIS — L820 Inflamed seborrheic keratosis: Secondary | ICD-10-CM | POA: Diagnosis not present

## 2016-11-02 ENCOUNTER — Other Ambulatory Visit: Payer: Self-pay | Admitting: Family Medicine

## 2016-11-28 ENCOUNTER — Other Ambulatory Visit: Payer: Self-pay | Admitting: Family Medicine

## 2016-11-28 DIAGNOSIS — E785 Hyperlipidemia, unspecified: Secondary | ICD-10-CM

## 2016-11-28 DIAGNOSIS — Z01419 Encounter for gynecological examination (general) (routine) without abnormal findings: Secondary | ICD-10-CM

## 2016-12-08 ENCOUNTER — Other Ambulatory Visit: Payer: 59

## 2016-12-09 ENCOUNTER — Other Ambulatory Visit (INDEPENDENT_AMBULATORY_CARE_PROVIDER_SITE_OTHER): Payer: 59

## 2016-12-09 DIAGNOSIS — Z01419 Encounter for gynecological examination (general) (routine) without abnormal findings: Secondary | ICD-10-CM

## 2016-12-09 DIAGNOSIS — E785 Hyperlipidemia, unspecified: Secondary | ICD-10-CM | POA: Diagnosis not present

## 2016-12-09 LAB — CBC WITH DIFFERENTIAL/PLATELET
BASOS PCT: 0.6 % (ref 0.0–3.0)
Basophils Absolute: 0 10*3/uL (ref 0.0–0.1)
EOS ABS: 0.1 10*3/uL (ref 0.0–0.7)
EOS PCT: 2.1 % (ref 0.0–5.0)
HEMATOCRIT: 40.9 % (ref 36.0–46.0)
HEMOGLOBIN: 13.6 g/dL (ref 12.0–15.0)
LYMPHS PCT: 29.1 % (ref 12.0–46.0)
Lymphs Abs: 1.8 10*3/uL (ref 0.7–4.0)
MCHC: 33.1 g/dL (ref 30.0–36.0)
MCV: 87.8 fl (ref 78.0–100.0)
MONO ABS: 0.4 10*3/uL (ref 0.1–1.0)
Monocytes Relative: 5.9 % (ref 3.0–12.0)
NEUTROS ABS: 3.9 10*3/uL (ref 1.4–7.7)
Neutrophils Relative %: 62.3 % (ref 43.0–77.0)
PLATELETS: 342 10*3/uL (ref 150.0–400.0)
RBC: 4.66 Mil/uL (ref 3.87–5.11)
RDW: 14.2 % (ref 11.5–15.5)
WBC: 6.3 10*3/uL (ref 4.0–10.5)

## 2016-12-09 LAB — LIPID PANEL
CHOL/HDL RATIO: 4
Cholesterol: 250 mg/dL — ABNORMAL HIGH (ref 0–200)
HDL: 70.9 mg/dL (ref >39.00)
LDL Cholesterol: 159 mg/dL — ABNORMAL HIGH (ref 0–99)
NONHDL: 179.14
Triglycerides: 101 mg/dL (ref 0.0–149.0)
VLDL: 20.2 mg/dL (ref 0.0–40.0)

## 2016-12-09 LAB — COMPREHENSIVE METABOLIC PANEL
ALK PHOS: 77 U/L (ref 39–117)
ALT: 58 U/L — AB (ref 0–35)
AST: 36 U/L (ref 0–37)
Albumin: 4.6 g/dL (ref 3.5–5.2)
BILIRUBIN TOTAL: 0.5 mg/dL (ref 0.2–1.2)
BUN: 11 mg/dL (ref 6–23)
CO2: 31 meq/L (ref 19–32)
CREATININE: 0.76 mg/dL (ref 0.40–1.20)
Calcium: 10.3 mg/dL (ref 8.4–10.5)
Chloride: 105 mEq/L (ref 96–112)
GFR: 83.29 mL/min (ref >60.00)
GLUCOSE: 96 mg/dL (ref 70–99)
Potassium: 3.8 mEq/L (ref 3.5–5.1)
Sodium: 143 mEq/L (ref 135–145)
TOTAL PROTEIN: 8.3 g/dL (ref 6.0–8.3)

## 2016-12-09 LAB — TSH: TSH: 1.41 u[IU]/mL (ref 0.35–4.50)

## 2016-12-10 LAB — HIV ANTIBODY (ROUTINE TESTING W REFLEX): HIV: NONREACTIVE

## 2016-12-10 LAB — HEPATITIS C ANTIBODY: HCV AB: NEGATIVE

## 2016-12-14 ENCOUNTER — Encounter: Payer: Self-pay | Admitting: Family Medicine

## 2016-12-14 ENCOUNTER — Ambulatory Visit (INDEPENDENT_AMBULATORY_CARE_PROVIDER_SITE_OTHER): Payer: 59 | Admitting: Family Medicine

## 2016-12-14 VITALS — BP 122/86 | HR 68 | Temp 98.1°F | Ht 62.0 in | Wt 149.8 lb

## 2016-12-14 DIAGNOSIS — I10 Essential (primary) hypertension: Secondary | ICD-10-CM

## 2016-12-14 DIAGNOSIS — Z01419 Encounter for gynecological examination (general) (routine) without abnormal findings: Secondary | ICD-10-CM

## 2016-12-14 DIAGNOSIS — E785 Hyperlipidemia, unspecified: Secondary | ICD-10-CM | POA: Diagnosis not present

## 2016-12-14 MED ORDER — AMLODIPINE BESYLATE 5 MG PO TABS: ORAL_TABLET | ORAL | 3 refills | Status: DC

## 2016-12-14 MED ORDER — PRAVASTATIN SODIUM 20 MG PO TABS: 10.0000 mg | ORAL_TABLET | Freq: Every day | ORAL | 3 refills | Status: DC

## 2016-12-14 NOTE — Progress Notes (Signed)
Subjective:   Patient ID: Joan Morgan, female    DOB: April 08, 1961, 56 y.o.   MRN: [id number]  Joan Morgan is a pleasant 56 y.o. year old female who presents to clinic today with Annual Exam and Medication Refill  on 12/14/2016  HPI:  Mammogram 10/14/13 Colonoscopy 2013 Last pap smear done by me on 10/14/14.  No h/o abnormal pap smears.  HTN- BP now well controlled on amlodipine 5 mgm daily.  ACEI caused cough. Lab Results  Component Value Date   CREATININE 0.76 12/09/2016   HLD- remains elevated.  Taking pravachol 10 mg daily.  Lab Results  Component Value Date   CHOL 250 (H) 12/09/2016   HDL 70.90 12/09/2016   LDLCALC 159 (H) 12/09/2016   LDLDIRECT 159.7 10/24/2012   TRIG 101.0 12/09/2016   CHOLHDL 4 12/09/2016   Lab Results  Component Value Date   TSH 1.41 12/09/2016   Lab Results  Component Value Date   WBC 6.3 12/09/2016   HGB 13.6 12/09/2016   HCT 40.9 12/09/2016   MCV 87.8 12/09/2016   PLT 342.0 12/09/2016   Current Outpatient Prescriptions on File Prior to Visit  Medication Sig Dispense Refill  . amLODipine (NORVASC) 5 MG tablet TAKE 1 TABLET (5 MG TOTAL) BY MOUTH DAILY. 30 tablet 10  . aspirin 81 MG chewable tablet Chew 81 mg by mouth daily.    . Biotin 5 MG TABS Take by mouth daily.    . cholecalciferol (VITAMIN D) 1000 UNITS tablet Take 1,000 Units by mouth daily.    Marland Kitchen GARLIC PO Take by mouth daily.    . Multiple Vitamin (MULTIVITAMIN WITH MINERALS) TABS tablet Take 1 tablet by mouth daily.    . Omega-3 Fatty Acids (FISH OIL) 1000 MG CAPS Take one by mouth daily    . pravastatin (PRAVACHOL) 10 MG tablet TAKE 1 TABLET (10 MG TOTAL) BY MOUTH DAILY. 90 tablet 0   No current facility-administered medications on file prior to visit.     Allergies  Allergen Reactions  . Ace Inhibitors     cough    Past Medical History:  Diagnosis Date  . Anemia   . Hyperlipidemia   . Hypertension     Past Surgical History:  Procedure Laterality Date    . TUBAL LIGATION      Family History  Problem Relation Age of Onset  . Hyperlipidemia Mother   . Hypertension Mother   . Hyperlipidemia Father     Social History   Social History  . Marital status: Married    Spouse name: N/A  . Number of children: N/A  . Years of education: N/A   Occupational History  . Not on file.   Social History Main Topics  . Smoking status: Never Smoker  . Smokeless tobacco: Never Used  . Alcohol use No  . Drug use: No  . Sexual activity: Not on file   Other Topics Concern  . Not on file   Social History Narrative   From [place].   Married, 2 sons ages 40 and 22 yo.   Works as Quarry manager at [employer].   The PMH, [place], Social History, Family History, Medications, and allergies have been reviewed in [facility], and have been updated if relevant.   Review of Systems  Constitutional: Negative.   HENT: Negative.   Cardiovascular: Negative.   Gastrointestinal: Negative.   Endocrine: Negative.   Genitourinary: Negative.   Musculoskeletal: Negative.   Skin: Negative.   Allergic/Immunologic: Negative.  Neurological: Negative.   Hematological: Negative.   Psychiatric/Behavioral: Negative.   All other systems reviewed and are negative.      Objective:    BP 122/86   Pulse 68   Temp 98.1 F (36.7 C) (Oral)   Ht 5\' 2"  (1.575 m)   Wt 149 lb 12.8 oz (67.9 kg)   SpO2 98%   BMI 27.40 kg/m    Physical Exam    General:  Well-developed,well-nourished,in no acute distress; alert,appropriate and cooperative throughout examination Head:  normocephalic and atraumatic.   Eyes:  vision grossly intact, PERRL Ears:  R ear normal and L ear normal externally, TMs clear bilaterally Nose:  no external deformity.   Mouth:  good dentition.   Neck:  No deformities, masses, or tenderness noted. Breasts:  No mass, nodules, thickening, tenderness, bulging, retraction, inflamation, nipple discharge or skin changes noted.   Lungs:  Normal respiratory  effort, chest expands symmetrically. Lungs are clear to auscultation, no crackles or wheezes. Heart:  Normal rate and regular rhythm. S1 and S2 normal without gallop, murmur, click, rub or other extra sounds. Abdomen:  Bowel sounds positive,abdomen soft and non-tender without masses, organomegaly or hernias noted. Msk:  No deformity or scoliosis noted of thoracic or lumbar spine.   Extremities:  No clubbing, cyanosis, edema, or deformity noted with normal full range of motion of all joints.   Neurologic:  alert & oriented X3 and gait normal.   Skin:  Intact without suspicious lesions or rashes Cervical Nodes:  No lymphadenopathy noted Axillary Nodes:  No palpable lymphadenopathy Psych:  Cognition and judgment appear intact. Alert and cooperative with normal attention span and concentration. No apparent delusions, illusions, hallucinations       Assessment & Plan:   Well woman exam  Essential hypertension  Hyperlipidemia, unspecified hyperlipidemia type No Follow-up on file.

## 2016-12-14 NOTE — Assessment & Plan Note (Signed)
Well controlled.  No changes made. 

## 2016-12-14 NOTE — Assessment & Plan Note (Signed)
Not quite at goal. Will increase pravachol to 20 mg daily.

## 2016-12-14 NOTE — Patient Instructions (Signed)
Great to see you.  We are increasing your pravachol to 20 mg daily.

## 2016-12-14 NOTE — Assessment & Plan Note (Signed)
Reviewed preventive care protocols, scheduled due services, and updated immunizations Discussed nutrition, exercise, diet, and healthy lifestyle.  Pt to call to schedule mammogram. 

## 2016-12-15 ENCOUNTER — Encounter: Payer: Self-pay | Admitting: Family Medicine

## 2016-12-16 MED ORDER — PRAVASTATIN SODIUM 20 MG PO TABS: 20.0000 mg | ORAL_TABLET | Freq: Every day | ORAL | 3 refills | Status: DC

## 2016-12-23 ENCOUNTER — Other Ambulatory Visit: Payer: Self-pay | Admitting: Family Medicine

## 2016-12-23 DIAGNOSIS — Z1231 Encounter for screening mammogram for malignant neoplasm of breast: Secondary | ICD-10-CM

## 2017-01-13 ENCOUNTER — Ambulatory Visit
Admission: RE | Admit: 2017-01-13 | Discharge: 2017-01-13 | Disposition: A | Discharge status: Home / Self Care | Payer: 59 | Source: Ambulatory Visit | Attending: Family Medicine | Admitting: Family Medicine

## 2017-01-13 DIAGNOSIS — Z1231 Encounter for screening mammogram for malignant neoplasm of breast: Secondary | ICD-10-CM | POA: Insufficient documentation

## 2017-02-09 DIAGNOSIS — H524 Presbyopia: Secondary | ICD-10-CM | POA: Diagnosis not present

## 2017-08-16 ENCOUNTER — Ambulatory Visit (INDEPENDENT_AMBULATORY_CARE_PROVIDER_SITE_OTHER): Payer: 59 | Admitting: Internal Medicine

## 2017-08-16 ENCOUNTER — Encounter: Payer: Self-pay | Admitting: Internal Medicine

## 2017-08-16 VITALS — BP 124/74 | HR 64 | Temp 98.6°F | Resp 16 | Ht 62.0 in | Wt 137.0 lb

## 2017-08-16 DIAGNOSIS — Z1329 Encounter for screening for other suspected endocrine disorder: Secondary | ICD-10-CM

## 2017-08-16 DIAGNOSIS — I10 Essential (primary) hypertension: Secondary | ICD-10-CM | POA: Diagnosis not present

## 2017-08-16 DIAGNOSIS — E785 Hyperlipidemia, unspecified: Secondary | ICD-10-CM

## 2017-08-16 DIAGNOSIS — K5900 Constipation, unspecified: Secondary | ICD-10-CM | POA: Diagnosis not present

## 2017-08-16 DIAGNOSIS — Z1159 Encounter for screening for other viral diseases: Secondary | ICD-10-CM | POA: Diagnosis not present

## 2017-08-16 NOTE — Patient Instructions (Signed)
Please schedule fasting labs next week Schedule pap and physical by 10/14/14 no later than mid May  Take care   Seborrheic Keratosis Seborrheic keratosis is a common, noncancerous (benign) skin growth. This condition causes waxy, rough, tan, brown, or black spots to appear on the skin. These skin growths can be flat or raised. What are the causes? The cause of this condition is not known. What increases the risk? This condition is more likely to develop in:  People who have a family history of seborrheic keratosis.  People who are 41 or older.  People who are pregnant.  People who have had estrogen replacement therapy.  What are the signs or symptoms? This condition often occurs on the face, chest, shoulders, back, or other areas. These growths:  Are usually painless, but may become irritated and itchy.  Can be yellow, brown, black, or other colors.  Are slightly raised or have a flat surface.  Are sometimes rough or wart-like in texture.  Are often waxy on the surface.  Are round or oval-shaped.  Sometimes look like they are "stuck on."  Often occur in groups, but may occur as a single growth.  How is this diagnosed? This condition is diagnosed with a medical history and physical exam. A sample of the growth may be tested (skin biopsy). You may need to see a skin specialist (dermatologist). How is this treated? Treatment is not usually needed for this condition, unless the growths are irritated or are often bleeding. You may also choose to have the growths removed if you do not like their appearance. Most commonly, these growths are treated with a procedure in which liquid nitrogen is applied to "freeze" off the growth (cryosurgery). They may also be burned off with electricity or cut off. Follow these instructions at home:  Watch your growth for any changes.  Keep all follow-up visits as told by your health care provider. This is important.  Do not scratch or pick at  the growth or growths. This can cause them to become irritated or infected. Contact a health care provider if:  You suddenly have many new growths.  Your growth bleeds, itches, or hurts.  Your growth suddenly becomes larger or changes color. This information is not intended to replace advice given to you by your health care provider. Make sure you discuss any questions you have with your health care provider. Document Released: 08/20/2010 Document Revised: 12/24/2015 Document Reviewed: 12/03/2014 Elsevier Interactive Patient Education  2018 [employer].

## 2017-08-16 NOTE — Progress Notes (Addendum)
Chief Complaint  Patient presents with  . Establish Care   Establish  1. HTN controlled  2. HLD only has been on pravachol and increased dose last year ~8 months ago will come in for fasting labs next week  3. C/o left temple skin lesion reviewed with pt looks like SK   Review of Systems  Constitutional:       Down 12 lbs walking  HENT: Negative for hearing loss.   Eyes:       No vision changes   Respiratory: Negative for shortness of breath.   Cardiovascular: Negative for chest pain.  Gastrointestinal: Positive for constipation.       Tries teas to help   Genitourinary: Positive for frequency.       +inc urine freq with 1 gallon water drinking per day   Musculoskeletal: Negative for falls.  Skin:       +left temple brown lesion   Neurological: Negative for headaches.  Psychiatric/Behavioral: Negative for depression and memory loss.   Past Medical History:  Diagnosis Date  . Anemia   . Hyperlipidemia   . Hypertension    Past Surgical History:  Procedure Laterality Date  . TUBAL LIGATION     Family History  Problem Relation Age of Onset  . Hyperlipidemia Mother   . Hypertension Mother   . Hyperlipidemia Father   . Breast cancer Neg Hx    Social History   Socioeconomic History  . Marital status: Married    Spouse name: Not on file  . Number of children: Not on file  . Years of education: Not on file  . Highest education level: Not on file  Social Needs  . Financial resource strain: Not on file  . Food insecurity - worry: Not on file  . Food insecurity - inability: Not on file  . Transportation needs - medical: Not on file  . Transportation needs - non-medical: Not on file  Occupational History  . Not on file  Tobacco Use  . Smoking status: Never Smoker  . Smokeless tobacco: Never Used  Substance and Sexual Activity  . Alcohol use: No  . Drug use: No  . Sexual activity: Not on file  Other Topics Concern  . Not on file  Social History Narrative   From [place].   Married, 2 sons ages 46 and 57 yo.   Works as Quarry manager at [employer].   [place]  . amLODipine (NORVASC) 5 MG tablet TAKE 1 TABLET (5 MG TOTAL) BY MOUTH DAILY.  Marland Kitchen aspirin 81 MG chewable tablet Chew 81 mg by mouth daily.  . cholecalciferol (VITAMIN D) 1000 UNITS tablet Take 1,000 Units by mouth daily.   . Multiple Vitamin (MULTIVITAMIN WITH MINERALS) TABS tablet Take 1 tablet by mouth daily.  . Omega-3 Fatty Acids (FISH OIL) 1000 MG CAPS Take one by mouth daily  . pravastatin (PRAVACHOL) 20 MG tablet Take 1 tablet (20 mg total) by mouth daily.   Allergies  Allergen Reactions  . Ace Inhibitors     cough   No results found for this or any previous visit (from the past 2160 hour(s)). Objective  Body mass index is 25.06 kg/m. Wt Readings from Last 3 Encounters:  08/16/17 137 lb (62.1 kg)  12/14/16 149 lb 12.8 oz (67.9 kg)  11/18/15 137 lb 12 oz (62.5 kg)   Temp Readings from Last 3 Encounters:  08/16/17 98.6 F (37 C) (Oral)  12/14/16 98.1 F (36.7 C) (Oral)  11/18/15 98 F (36.7 C) (Oral)   BP Readings from Last 3 Encounters:  08/16/17 124/74  12/14/16 122/86  11/18/15 122/88   Pulse Readings from Last 3 Encounters:  08/16/17 64  12/14/16 68  11/18/15 (!) 49   O2 sat room air 98%  Physical Exam  Constitutional: She is oriented to person, place, and time and well-developed, well-nourished, and in no distress. Vital signs are normal.  HENT:  Head: Normocephalic and atraumatic.  Mouth/Throat: Oropharynx is clear and moist and mucous membranes are normal.  Eyes: Conjunctivae are normal. Pupils are equal, round, and reactive to light.  Cardiovascular: Normal rate, regular rhythm and normal heart sounds.  Pulmonary/Chest: Effort normal and breath sounds normal.  Abdominal: Soft. Bowel sounds are normal. There is no tenderness.  Neurological: She is alert and oriented to person, place, and time. Gait normal. Gait normal.  Skin:  Skin is warm and dry.     Psychiatric: Mood, memory, affect and judgment normal.  Nursing note and vitals reviewed.   Assessment   1. HTN controlled, HLD uncontrolled  2. SK left temple  3. Constipation  4. HM Plan  1. Cont meds  Check labs next week CMET, CBC, lipid, UA, TSH, T4, hep C negative, check hep B status, HIV neg  2. Reviewed and given info  3. Already drinking water 1 gallon per day, disc prune juice/Metamucil/Benefiber can try she doesn't like medications  4.  Had flu shot  Given Rx Tdap today  Consider and disc shingrix for future   Never smoker  Saw dermatology last year for toenail fungus that never cleared  mammo had 01/13/17 neg pt wants to do every other year and will do breast and pap at f/u  Last pap 10/14/14 neg due again at f/u soon neg HPV prev pap not sexually active  Colonoscopy need copy [place] done 5-6 years ago [place] noncancerous polpys in dad they rec 5 year repeat pt wants to wait until 2023  -got copy had 08/18/11 normal hemorrhoids repeat in 5 years   Provider: Dr. Olivia Mackie McLean-Scocuzza-Internal Medicine

## 2017-08-24 ENCOUNTER — Other Ambulatory Visit (INDEPENDENT_AMBULATORY_CARE_PROVIDER_SITE_OTHER): Payer: 59

## 2017-08-24 DIAGNOSIS — Z1329 Encounter for screening for other suspected endocrine disorder: Secondary | ICD-10-CM

## 2017-08-24 DIAGNOSIS — Z1159 Encounter for screening for other viral diseases: Secondary | ICD-10-CM

## 2017-08-24 DIAGNOSIS — E785 Hyperlipidemia, unspecified: Secondary | ICD-10-CM | POA: Diagnosis not present

## 2017-08-24 DIAGNOSIS — I10 Essential (primary) hypertension: Secondary | ICD-10-CM

## 2017-08-24 LAB — CBC WITH DIFFERENTIAL/PLATELET
BASOS ABS: 0 10*3/uL (ref 0.0–0.1)
Basophils Relative: 0.7 % (ref 0.0–3.0)
EOS ABS: 0.1 10*3/uL (ref 0.0–0.7)
EOS PCT: 1.2 % (ref 0.0–5.0)
HCT: 44.2 % (ref 36.0–46.0)
HEMOGLOBIN: 14.8 g/dL (ref 12.0–15.0)
Lymphocytes Relative: 40.7 % (ref 12.0–46.0)
Lymphs Abs: 2.5 10*3/uL (ref 0.7–4.0)
MCHC: 33.6 g/dL (ref 30.0–36.0)
MCV: 86.9 fl (ref 78.0–100.0)
MONO ABS: 0.3 10*3/uL (ref 0.1–1.0)
Monocytes Relative: 5.3 % (ref 3.0–12.0)
Neutro Abs: 3.1 10*3/uL (ref 1.4–7.7)
Neutrophils Relative %: 52.1 % (ref 43.0–77.0)
Platelets: 354 10*3/uL (ref 150.0–400.0)
RBC: 5.08 Mil/uL (ref 3.87–5.11)
RDW: 13.2 % (ref 11.5–15.5)
WBC: 6 10*3/uL (ref 4.0–10.5)

## 2017-08-24 LAB — COMPREHENSIVE METABOLIC PANEL
ALBUMIN: 4.7 g/dL (ref 3.5–5.2)
ALK PHOS: 86 U/L (ref 39–117)
ALT: 27 U/L (ref 0–35)
AST: 20 U/L (ref 0–37)
BUN: 9 mg/dL (ref 6–23)
CO2: 30 mEq/L (ref 19–32)
CREATININE: 0.74 mg/dL (ref 0.40–1.20)
Calcium: 10 mg/dL (ref 8.4–10.5)
Chloride: 105 mEq/L (ref 96–112)
GFR: 85.67 mL/min (ref >60.00)
Glucose, Bld: 100 mg/dL — ABNORMAL HIGH (ref 70–99)
Potassium: 3.7 mEq/L (ref 3.5–5.1)
SODIUM: 142 meq/L (ref 135–145)
TOTAL PROTEIN: 8.6 g/dL — AB (ref 6.0–8.3)
Total Bilirubin: 0.8 mg/dL (ref 0.2–1.2)

## 2017-08-24 LAB — LIPID PANEL
Cholesterol: 226 mg/dL — ABNORMAL HIGH (ref 0–200)
HDL: 68.4 mg/dL (ref >39.00)
LDL CALC: 139 mg/dL — AB (ref 0–99)
NONHDL: 157.66
Total CHOL/HDL Ratio: 3
Triglycerides: 92 mg/dL (ref 0.0–149.0)
VLDL: 18.4 mg/dL (ref 0.0–40.0)

## 2017-08-24 LAB — HEPATITIS B SURFACE ANTIBODY, QUANTITATIVE

## 2017-08-24 LAB — T4, FREE: Free T4: 1.07 ng/dL (ref 0.60–1.60)

## 2017-08-24 LAB — TSH: TSH: 1.38 u[IU]/mL (ref 0.35–4.50)

## 2017-08-24 LAB — URINALYSIS, ROUTINE W REFLEX MICROSCOPIC
BILIRUBIN URINE: NEGATIVE
HGB URINE DIPSTICK: NEGATIVE
Ketones, ur: NEGATIVE
LEUKOCYTES UA: NEGATIVE
NITRITE: NEGATIVE
RBC / HPF: NONE SEEN (ref 0–?)
Specific Gravity, Urine: <=1.005 — AB (ref 1.000–1.030)
Total Protein, Urine: NEGATIVE
URINE GLUCOSE: NEGATIVE
Urobilinogen, UA: 0.2 (ref 0.0–1.0)
WBC, UA: NONE SEEN (ref 0–?)
pH: 7 (ref 5.0–8.0)

## 2017-08-24 LAB — HEPATITIS B SURFACE ANTIGEN: Hepatitis B Surface Ag: NONREACTIVE

## 2017-08-29 ENCOUNTER — Encounter: Payer: Self-pay | Admitting: Family Medicine

## 2017-08-30 ENCOUNTER — Other Ambulatory Visit: Payer: Self-pay | Admitting: Internal Medicine

## 2017-08-30 DIAGNOSIS — E785 Hyperlipidemia, unspecified: Secondary | ICD-10-CM

## 2017-08-30 MED ORDER — PRAVASTATIN SODIUM 40 MG PO TABS: 40.0000 mg | ORAL_TABLET | Freq: Every day | ORAL | 1 refills | Status: DC

## 2017-11-27 ENCOUNTER — Encounter: Payer: Self-pay | Admitting: Internal Medicine

## 2017-11-28 ENCOUNTER — Other Ambulatory Visit: Payer: Self-pay | Admitting: Internal Medicine

## 2017-11-28 DIAGNOSIS — R739 Hyperglycemia, unspecified: Secondary | ICD-10-CM

## 2017-11-28 DIAGNOSIS — I10 Essential (primary) hypertension: Secondary | ICD-10-CM

## 2017-11-28 DIAGNOSIS — E559 Vitamin D deficiency, unspecified: Secondary | ICD-10-CM

## 2017-11-28 DIAGNOSIS — E785 Hyperlipidemia, unspecified: Secondary | ICD-10-CM

## 2017-12-01 ENCOUNTER — Other Ambulatory Visit (INDEPENDENT_AMBULATORY_CARE_PROVIDER_SITE_OTHER): Payer: 59

## 2017-12-01 DIAGNOSIS — E785 Hyperlipidemia, unspecified: Secondary | ICD-10-CM

## 2017-12-01 DIAGNOSIS — R739 Hyperglycemia, unspecified: Secondary | ICD-10-CM

## 2017-12-01 DIAGNOSIS — E559 Vitamin D deficiency, unspecified: Secondary | ICD-10-CM | POA: Diagnosis not present

## 2017-12-01 DIAGNOSIS — I10 Essential (primary) hypertension: Secondary | ICD-10-CM

## 2017-12-01 LAB — BASIC METABOLIC PANEL
BUN: 12 mg/dL (ref 6–23)
CALCIUM: 9.5 mg/dL (ref 8.4–10.5)
CO2: 30 meq/L (ref 19–32)
Chloride: 108 mEq/L (ref 96–112)
Creatinine, Ser: 0.78 mg/dL (ref 0.40–1.20)
GFR: 80.55 mL/min (ref >60.00)
GLUCOSE: 100 mg/dL — AB (ref 70–99)
POTASSIUM: 3.6 meq/L (ref 3.5–5.1)
Sodium: 146 mEq/L — ABNORMAL HIGH (ref 135–145)

## 2017-12-01 LAB — LIPID PANEL
CHOL/HDL RATIO: 3
Cholesterol: 189 mg/dL (ref 0–200)
HDL: 62.5 mg/dL (ref >39.00)
LDL Cholesterol: 107 mg/dL — ABNORMAL HIGH (ref 0–99)
NONHDL: 126.98
Triglycerides: 98 mg/dL (ref 0.0–149.0)
VLDL: 19.6 mg/dL (ref 0.0–40.0)

## 2017-12-01 LAB — VITAMIN D 25 HYDROXY (VIT D DEFICIENCY, FRACTURES): VITD: 32.22 ng/mL (ref 30.00–100.00)

## 2017-12-01 LAB — HEMOGLOBIN A1C: HEMOGLOBIN A1C: 6 % (ref 4.6–6.5)

## 2017-12-06 ENCOUNTER — Ambulatory Visit (INDEPENDENT_AMBULATORY_CARE_PROVIDER_SITE_OTHER): Payer: 59

## 2017-12-06 ENCOUNTER — Ambulatory Visit (INDEPENDENT_AMBULATORY_CARE_PROVIDER_SITE_OTHER): Payer: 59 | Admitting: Internal Medicine

## 2017-12-06 ENCOUNTER — Encounter: Payer: Self-pay | Admitting: Internal Medicine

## 2017-12-06 VITALS — BP 130/80 | HR 57 | Temp 97.8°F | Ht 62.0 in | Wt 138.0 lb

## 2017-12-06 DIAGNOSIS — Z23 Encounter for immunization: Secondary | ICD-10-CM | POA: Diagnosis not present

## 2017-12-06 DIAGNOSIS — M436 Torticollis: Secondary | ICD-10-CM

## 2017-12-06 DIAGNOSIS — Z0000 Encounter for general adult medical examination without abnormal findings: Secondary | ICD-10-CM

## 2017-12-06 DIAGNOSIS — E785 Hyperlipidemia, unspecified: Secondary | ICD-10-CM | POA: Diagnosis not present

## 2017-12-06 DIAGNOSIS — I10 Essential (primary) hypertension: Secondary | ICD-10-CM | POA: Diagnosis not present

## 2017-12-06 DIAGNOSIS — E870 Hyperosmolality and hypernatremia: Secondary | ICD-10-CM

## 2017-12-06 DIAGNOSIS — M50322 Other cervical disc degeneration at C5-C6 level: Secondary | ICD-10-CM | POA: Diagnosis not present

## 2017-12-06 MED ORDER — AMLODIPINE BESYLATE 5 MG PO TABS: ORAL_TABLET | ORAL | 3 refills | Status: DC

## 2017-12-06 NOTE — Progress Notes (Signed)
Chief Complaint  Patient presents with  . Annual Exam   Annual  1. Reviewed labs Na sl elevated drinking enough water  2. Declines pap today and wants to wait until 12/2018 for pap and mammogram  3 HTN controlled norvasc 5 mg qd HLD improved   Review of Systems  Constitutional: Negative for weight loss.  HENT: Negative for hearing loss.   Eyes: Negative for blurred vision.  Respiratory: Negative for shortness of breath.   Cardiovascular: Negative for chest pain.  Gastrointestinal: Negative for abdominal pain.  Musculoskeletal:       Neck stiffness   Skin: Negative for rash.  Neurological: Positive for sensory change.  Psychiatric/Behavioral: Negative for depression.   Past Medical History:  Diagnosis Date  . Anemia   . Hyperlipidemia   . Hypertension    Past Surgical History:  Procedure Laterality Date  . TUBAL LIGATION     Family History  Problem Relation Age of Onset  . Hyperlipidemia Mother   . Hypertension Mother   . Hyperlipidemia Father   . Breast cancer Neg Hx    Social History   Socioeconomic History  . Marital status: Married    Spouse name: Not on file  . Number of children: Not on file  . Years of education: Not on file  . Highest education level: Not on file  Occupational History  . Not on file  Social Needs  . Financial resource strain: Not on file  . Food insecurity:    Worry: Not on file    Inability: Not on file  . Transportation needs:    Medical: Not on file    Non-medical: Not on file  Tobacco Use  . Smoking status: Never Smoker  . Smokeless tobacco: Never Used  Substance and Sexual Activity  . Alcohol use: No  . Drug use: No  . Sexual activity: Not on file  Lifestyle  . Physical activity:    Days per week: Not on file    Minutes per session: Not on file  . Stress: Not on file  Relationships  . Social connections:    Talks on phone: Not on file    Gets together: Not on file    Attends religious service: Not on file    Active  member of club or organization: Not on file    Attends meetings of clubs or organizations: Not on file    Relationship status: Not on file  . Intimate partner violence:    Fear of current or ex partner: Not on file    Emotionally abused: Not on file    Physically abused: Not on file    Forced sexual activity: Not on file  Other Topics Concern  . Not on file  Social History Narrative   From [place].   Married, 2 sons ages 28 and 43 yo.   Works as Quarry manager at [employer].   [place]  . amLODipine (NORVASC) 5 MG tablet TAKE 1 TABLET (5 MG TOTAL) BY MOUTH DAILY.  Marland Kitchen aspirin 81 MG chewable tablet Chew 81 mg by mouth daily.  . Multiple Vitamin (MULTIVITAMIN WITH MINERALS) TABS tablet Take 1 tablet by mouth daily.  . Omega-3 Fatty Acids (FISH OIL) 1000 MG CAPS Take one by mouth daily  . pravastatin (PRAVACHOL) 40 MG tablet Take 1 tablet (40 mg total) by mouth at bedtime.   Allergies  Allergen Reactions  . Ace Inhibitors     cough   Recent Results (from the  past 2160 hour(s))  Basic Metabolic Panel (BMET)     Status: Abnormal   Collection Time: 12/01/17  8:14 AM  Result Value Ref Range   Sodium 146 (H) 135 - 145 mEq/L   Potassium 3.6 3.5 - 5.1 mEq/L   Chloride 108 96 - 112 mEq/L   CO2 30 19 - 32 mEq/L   Glucose, Bld 100 (H) 70 - 99 mg/dL   BUN 12 6 - 23 mg/dL   Creatinine, Ser 0.78 0.40 - 1.20 mg/dL   Calcium 9.5 8.4 - 10.5 mg/dL   GFR 80.55 >60.00 mL/min  Lipid panel     Status: Abnormal   Collection Time: 12/01/17  8:14 AM  Result Value Ref Range   Cholesterol 189 0 - 200 mg/dL    Comment: ATP III Classification       Desirable:  < 200 mg/dL               Borderline High:  200 - 239 mg/dL          High:  > = 240 mg/dL   Triglycerides 98.0 0.0 - 149.0 mg/dL    Comment: Normal:  <150 mg/dLBorderline High:  150 - 199 mg/dL   HDL 62.50 >39.00 mg/dL   VLDL 19.6 0.0 - 40.0 mg/dL   LDL Cholesterol 107 (H) 0 - 99 mg/dL   Total CHOL/HDL Ratio 3      Comment:                Men          Women1/2 Average Risk     3.4          3.3Average Risk          5.0          4.42X Average Risk          9.6          7.13X Average Risk          15.0          11.0                       NonHDL 126.98     Comment: NOTE:  Non-HDL goal should be 30 mg/dL higher than patient's LDL goal (i.e. LDL goal of < 70 mg/dL, would have non-HDL goal of < 100 mg/dL)  Hemoglobin A1c     Status: None   Collection Time: 12/01/17  8:14 AM  Result Value Ref Range   Hgb A1c MFr Bld 6.0 4.6 - 6.5 %    Comment: Glycemic Control Guidelines for People with Diabetes:Non Diabetic:  <6%Goal of Therapy: <7%Additional Action Suggested:  >8%   Vitamin D (25 hydroxy)     Status: None   Collection Time: 12/01/17  8:14 AM  Result Value Ref Range   VITD 32.22 30.00 - 100.00 ng/mL   Objective  Body mass index is 25.24 kg/m. Wt Readings from Last 3 Encounters:  12/06/17 138 lb (62.6 kg)  08/16/17 137 lb (62.1 kg)  12/14/16 149 lb 12.8 oz (67.9 kg)   Temp Readings from Last 3 Encounters:  12/06/17 97.8 F (36.6 C) (Oral)  08/16/17 98.6 F (37 C) (Oral)  12/14/16 98.1 F (36.7 C) (Oral)   BP Readings from Last 3 Encounters:  12/06/17 130/80  08/16/17 124/74  12/14/16 122/86   Pulse Readings from Last 3 Encounters:  12/06/17 (!) 57  08/16/17 64  12/14/16 68    Physical Exam  Constitutional: She is oriented to person, place, and time. Vital signs are normal. She appears well-developed and well-nourished. She is cooperative.  HENT:  Head: Normocephalic and atraumatic.  Mouth/Throat: Oropharynx is clear and moist and mucous membranes are normal.  Eyes: Pupils are equal, round, and reactive to light. Conjunctivae are normal.  Neck:  Curvature of neck   Cardiovascular: Normal rate, regular rhythm and normal heart sounds.  Pulmonary/Chest: Effort normal and breath sounds normal. Right breast exhibits no inverted nipple, no mass, no nipple discharge, no skin change and no  tenderness. Left breast exhibits no inverted nipple, no mass, no nipple discharge, no skin change and no tenderness. No breast swelling, tenderness, discharge or bleeding. Breasts are symmetrical.  Abdominal: Soft. Bowel sounds are normal. There is no tenderness.  Neurological: She is alert and oriented to person, place, and time. Gait normal.  Skin: Skin is warm, dry and intact.  Psychiatric: She has a normal mood and affect. Her speech is normal and behavior is normal. Judgment and thought content normal. Cognition and memory are normal.  Nursing note and vitals reviewed.   Assessment   1. Annual  2. Neck stiffness and numbness/tingling in hands  3. HM Plan   1 and 3.  Breast exam today mammo wants to do 12/2018 and pap in 2020 as well  Had flu shot  Tdap today  Consider and disc shingrix for future   Never smoker  Saw dermatology last year for toenail fungus that never cleared  mammo had 01/13/17 neg pt wants to do every other year and will do breast exam today and pap declines today  Last pap 10/14/14 neg due again at f/u soon neg HPV prev pap not sexually active  -wait another year  Colonoscopy need copy [place] done 5-6 years ago [place] noncancerous polpys in dad they rec 5 year repeat pt wants to wait until 2023  -got copy had 08/18/11 normal hemorrhoids repeat in 5 years  Increase exercise doing 4 days a week increase more, also rec change white rice to multigrain or brown  2. Xray neck today  Consider NCS/EMG  Provider: Dr. Olivia Mackie McLean-Scocuzza-Internal Medicine

## 2017-12-06 NOTE — Progress Notes (Signed)
Pre visit review using our clinic review tool, if applicable. No additional management support is needed unless otherwise documented below in the visit note. 

## 2017-12-06 NOTE — Patient Instructions (Addendum)
F/u in 4 months sooner if needed  Take care  rec D3 1000 IU daily   Hepatitis B Vaccine, Recombinant injection What is this medicine? HEPATITIS B VACCINE (hep uh TAHY tis B VAK seen) is a vaccine. It is used to prevent an infection with the hepatitis B virus. This medicine may be used for other purposes; ask your health care provider or pharmacist if you have questions. COMMON BRAND NAME(S): Engerix-B, Recombivax HB What should I tell my health care provider before I take this medicine? They need to know if you have any of these conditions: -fever, infection -heart disease -hepatitis B infection -immune system problems -kidney disease -an unusual or allergic reaction to vaccines, yeast, other medicines, foods, dyes, or preservatives -pregnant or trying to get pregnant -breast-feeding How should I use this medicine? This vaccine is for injection into a muscle. It is given by a health care professional. A copy of Vaccine Information Statements will be given before each vaccination. Read this sheet carefully each time. The sheet may change frequently. Talk to your pediatrician regarding the use of this medicine in children. While this drug may be prescribed for children as young as newborn for selected conditions, precautions do apply. Overdosage: If you think you have taken too much of this medicine contact a poison control center or emergency room at once. NOTE: This medicine is only for you. Do not share this medicine with others. What if I miss a dose? It is important not to miss your dose. Call your doctor or health care professional if you are unable to keep an appointment. What may interact with this medicine? -medicines that suppress your immune function like adalimumab, anakinra, infliximab -medicines to treat cancer -steroid medicines like prednisone or cortisone This list may not describe all possible interactions. Give your health care provider a list of all the medicines,  herbs, non-prescription drugs, or dietary supplements you use. Also tell them if you smoke, drink alcohol, or use illegal drugs. Some items may interact with your medicine. What should I watch for while using this medicine? See your health care provider for all shots of this vaccine as directed. You must have 3 shots of this vaccine for protection from hepatitis B infection. Tell your doctor right away if you have any serious or unusual side effects after getting this vaccine. What side effects may I notice from receiving this medicine? Side effects that you should report to your doctor or health care professional as soon as possible: -allergic reactions like skin rash, itching or hives, swelling of the face, lips, or tongue -breathing problems -confused, irritated -fast, irregular heartbeat -flu-like syndrome -numb, tingling pain -seizures -unusually weak or tired Side effects that usually do not require medical attention (report to your doctor or health care professional if they continue or are bothersome): -diarrhea -fever -headache -loss of appetite -muscle pain -nausea -pain, redness, swelling, or irritation at site where injected -tiredness This list may not describe all possible side effects. Call your doctor for medical advice about side effects. You may report side effects to FDA at 1-800-FDA-1088. Where should I keep my medicine? This drug is given in a hospital or clinic and will not be stored at home. NOTE: This sheet is a summary. It may not cover all possible information. If you have questions about this medicine, talk to your doctor, pharmacist, or health care provider.  2018 Elsevier/Gold Standard (2013-11-18 13:26:01)  Tdap/DTaP Vaccine (Diphtheria, Tetanus, and Pertussis): What You Need to Know 1. Why  get vaccinated? Diphtheria, tetanus, and pertussis are serious diseases caused by bacteria. Diphtheria and pertussis are spread from person to person. Tetanus enters the  body through cuts or wounds. DIPHTHERIA causes a thick covering in the back of the throat.  It can lead to breathing problems, paralysis, heart failure, and even death.  TETANUS (Lockjaw) causes painful tightening of the muscles, usually all over the body.  It can lead to "locking" of the jaw so the victim cannot open his mouth or swallow. Tetanus leads to death in up to 2 out of 10 cases.  PERTUSSIS (Whooping Cough) causes coughing spells so bad that it is hard for infants to eat, drink, or breathe. These spells can last for weeks.  It can lead to pneumonia, seizures (jerking and staring spells), brain damage, and death.  Diphtheria, tetanus, and pertussis vaccine (DTaP) can help prevent these diseases. Most children who are vaccinated with DTaP will be protected throughout childhood. Many more children would get these diseases if we stopped vaccinating. DTaP is a safer version of an older vaccine called DTP. DTP is no longer used in the [place]. 2. Who should get DTaP vaccine and when? Children should get 5 doses of DTaP vaccine, one dose at each of the following ages:  2 months  4 months  6 months  15-18 months  4-6 years  DTaP may be given at the same time as other vaccines. 3. Some children should not get DTaP vaccine or should wait  Children with minor illnesses, such as a cold, may be vaccinated. But children who are moderately or severely ill should usually wait until they recover before getting DTaP vaccine.  Any child who had a life-threatening allergic reaction after a dose of DTaP should not get another dose.  Any child who suffered a brain or nervous system disease within 7 days after a dose of DTaP should not get another dose.  Talk with your doctor if your child: ? had a seizure or collapsed after a dose of DTaP, ? cried non-stop for 3 hours or more after a dose of DTaP, ? had a fever over 105F after a dose of DTaP. Ask your doctor for more information.  Some of these children should not get another dose of pertussis vaccine, but may get a vaccine without pertussis, called DT. 4. Older children and adults DTaP is not licensed for adolescents, adults, or children 84 years of age and older. But older people still need protection. A vaccine called Tdap is similar to DTaP. A single dose of Tdap is recommended for people 11 through 58 years of age. Another vaccine, called Td, protects against tetanus and diphtheria, but not pertussis. It is recommended every 10 years. There are separate Vaccine Information Statements for these vaccines. 5. What are the risks from DTaP vaccine? Getting diphtheria, tetanus, or pertussis disease is much riskier than getting DTaP vaccine. However, a vaccine, like any medicine, is capable of causing serious problems, such as severe allergic reactions. The risk of DTaP vaccine causing serious harm, or death, is extremely small. Mild problems (common)  Fever (up to about 1 child in 4)  Redness or swelling where the shot was given (up to about 1 child in 4)  Soreness or tenderness where the shot was given (up to about 1 child in 4) These problems occur more often after the 4th and 5th doses of the DTaP series than after earlier doses. Sometimes the 4th or 5th dose of DTaP vaccine is  followed by swelling of the entire arm or leg in which the shot was given, lasting 1-7 days (up to about 1 child in 66). Other mild problems include:  Fussiness (up to about 1 child in 3)  Tiredness or poor appetite (up to about 1 child in 10)  Vomiting (up to about 1 child in 40) These problems generally occur 1-3 days after the shot. Moderate problems (uncommon)  Seizure (jerking or staring) (about 1 child out of 14,000)  Non-stop crying, for 3 hours or more (up to about 1 child out of 1,000)  High fever, over 105F (about 1 child out of 16,000) Severe problems (very rare)  Serious allergic reaction (less than 1 out of a million  doses)  Several other severe problems have been reported after DTaP vaccine. These include: ? Long-term seizures, coma, or lowered consciousness ? Permanent brain damage. These are so rare it is hard to tell if they are caused by the vaccine. Controlling fever is especially important for children who have had seizures, for any reason. It is also important if another family member has had seizures. You can reduce fever and pain by giving your child an aspirin-free pain reliever when the shot is given, and for the next 24 hours, following the package instructions. 6. What if there is a serious reaction? What should I look for? Look for anything that concerns you, such as signs of a severe allergic reaction, very high fever, or behavior changes. Signs of a severe allergic reaction can include hives, swelling of the face and throat, difficulty breathing, a fast heartbeat, dizziness, and weakness. These would start a few minutes to a few hours after the vaccination. What should I do?  If you think it is a severe allergic reaction or other emergency that can't wait, call 9-1-1 or get the person to the nearest hospital. Otherwise, call your doctor.  Afterward, the reaction should be reported to the Vaccine Adverse Event Reporting System (VAERS). Your doctor might file this report, or you can do it yourself through the VAERS web site at www.vaers.SamedayNews.es, or by calling 7204366028. ? VAERS is only for reporting reactions. They do not give medical advice. 7. The National Vaccine Injury Compensation Program The [employer] Vaccine Injury Compensation Program (VICP) is a federal program that was created to compensate people who may have been injured by certain vaccines. Persons who believe they may have been injured by a vaccine can learn about the program and about filing a claim by calling (223)727-5243 or visiting the [place] website at GoldCloset.com.ee. 8. How can I learn more?  Ask your  doctor.  Call your local or state health department.  Contact the Centers for Disease Control and Prevention (CDC): ? Call (204) 089-3815 (1-800-CDC-INFO) or ? Visit CDC's website at http://hunter.com/ CDC DTaP Vaccine (Diphtheria, Tetanus, and Pertussis) VIS (12/15/05) This information is not intended to replace advice given to you by your health care provider. Make sure you discuss any questions you have with your health care provider. Document Released: 05/15/2006 Document Revised: 04/07/2016 Document Reviewed: 04/07/2016 Elsevier Interactive Patient Education  2017 [employer].

## 2017-12-13 ENCOUNTER — Encounter: Payer: Self-pay | Admitting: *Deleted

## 2017-12-14 ENCOUNTER — Other Ambulatory Visit (INDEPENDENT_AMBULATORY_CARE_PROVIDER_SITE_OTHER): Payer: 59

## 2017-12-14 DIAGNOSIS — E870 Hyperosmolality and hypernatremia: Secondary | ICD-10-CM | POA: Diagnosis not present

## 2017-12-14 LAB — BASIC METABOLIC PANEL
BUN: 12 mg/dL (ref 6–23)
CHLORIDE: 105 meq/L (ref 96–112)
CO2: 29 meq/L (ref 19–32)
Calcium: 9.7 mg/dL (ref 8.4–10.5)
Creatinine, Ser: 0.87 mg/dL (ref 0.40–1.20)
GFR: 71 mL/min (ref >60.00)
GLUCOSE: 99 mg/dL (ref 70–99)
POTASSIUM: 3.9 meq/L (ref 3.5–5.1)
Sodium: 140 mEq/L (ref 135–145)

## 2018-03-14 DIAGNOSIS — H524 Presbyopia: Secondary | ICD-10-CM | POA: Diagnosis not present

## 2018-03-14 DIAGNOSIS — H2513 Age-related nuclear cataract, bilateral: Secondary | ICD-10-CM | POA: Diagnosis not present

## 2018-04-18 ENCOUNTER — Ambulatory Visit: Payer: 59 | Admitting: Internal Medicine

## 2018-04-26 ENCOUNTER — Other Ambulatory Visit: Payer: Self-pay | Admitting: Internal Medicine

## 2018-04-26 DIAGNOSIS — E785 Hyperlipidemia, unspecified: Secondary | ICD-10-CM

## 2018-04-26 MED ORDER — PRAVASTATIN SODIUM 40 MG PO TABS: 40.0000 mg | ORAL_TABLET | Freq: Every day | ORAL | 3 refills | Status: DC

## 2018-06-12 NOTE — Progress Notes (Signed)
Cardiology Office Note  Date:  06/13/2018   ID:  Joan Morgan, DOB 1961/01/13, MRN [id number]  PCP:  McLean-Scocuzza, Nino Glow, MD   Chief Complaint  Patient presents with  . other    OD f/u former [facility] pt no complaints today. Meds reviewed verbally with pt.    HPI:  Joan Morgan is a 57 y.o. female  HTN Previously seen for chest pain.2017 Who presents for follow-up of her chest pain symptoms  Reports having symptoms of feeling numb and tingly all over, chest arms legs Symptoms seem to come and go Concerned about circulation  Otherwise very active at work, Good exercise tolerance Rare episodes of chest discomfort at rest, not with exertion  EKG personally reviewed by myself on todays visit Shows normal sinus rhythm rate 59 bpm no significant ST or T wave changes  Other past medical history reviewed Stress echo 10/22/2015: Stress ECG conclusions: There were no stress arrhythmias or  conduction abnormalities. The stress ECG was normal. - Staged echo: Normal echo stress - Good exercise tolerance, 11.9 METS achieved, target heart rate  achieved.  Resting EF 55%, peak stress EF 65 to 70%  No significant wall motion abnormalities noted at peak stress or  in recovery.  Impressions:  - Normal study after maximal exercise.  Echo 10/26/2015: Left ventricle: The cavity size was normal. Systolic function was  normal. The estimated ejection fraction was in the range of 60%  to 65%. Wall motion was normal; there were no regional wall  motion abnormalities. Left ventricular diastolic function  parameters were normal. - Left atrium: The atrium was normal in size. - Right ventricle: Systolic function was normal. - Pulmonary arteries: Systolic pressure was within the normal  range.  Impressions:  - Normal study.  Holter 10/26/2015:  Sinus rhythm  Sinus bradycardia 46% of the total number of beats, minimum heart rate of 48 BPM. No symptom  recorded.  No high grade supraventricular or ventricular ectopy    PMH:   has a past medical history of Anemia, Hyperlipidemia, and Hypertension.  PSH:    Past Surgical History:  Procedure Laterality Date  . TUBAL LIGATION      Current Outpatient Medications  Medication Sig Dispense Refill  . amLODipine (NORVASC) 5 MG tablet TAKE 1 TABLET (5 MG TOTAL) BY MOUTH DAILY. 90 tablet 3  . aspirin 81 MG chewable tablet Chew 81 mg by mouth daily.    . Multiple Vitamin (MULTIVITAMIN WITH MINERALS) TABS tablet Take 1 tablet by mouth daily.    . Omega-3 Fatty Acids (FISH OIL) 1000 MG CAPS Take one by mouth daily    . pravastatin (PRAVACHOL) 40 MG tablet Take 1 tablet (40 mg total) by mouth at bedtime. 90 tablet 3   No current facility-administered medications for this visit.      Allergies:   Ace inhibitors   Social History:  The patient  reports that she has never smoked. She has never used smokeless tobacco. She reports that she does not drink alcohol or use drugs.   Family History:   family history includes Hyperlipidemia in her father and mother; Hypertension in her mother.    Review of Systems: Review of Systems  Constitutional: Negative.   Respiratory: Negative.   Cardiovascular: Negative.   Gastrointestinal: Negative.   Musculoskeletal: Negative.   Neurological: Negative.   Psychiatric/Behavioral: Negative.   All other systems reviewed and are negative.    PHYSICAL EXAM: VS:  BP 130/82 (BP Location: Left Arm, Patient Position: Sitting, Cuff  Size: Normal)   Pulse (!) 59   Ht 5\' 2"  (1.575 m)   Wt 140 lb 12 oz (63.8 kg)   BMI 25.74 kg/m  , BMI Body mass index is 25.74 kg/m. GEN: Well nourished, well developed, in no acute distress  HEENT: normal  Neck: no JVD, carotid bruits, or masses Cardiac: RRR; no murmurs, rubs, or gallops,no edema  Respiratory:  clear to auscultation bilaterally, normal work of breathing GI: soft, nontender, nondistended, + BS MS: no  deformity or atrophy  Skin: warm and dry, no rash Neuro:  Strength and sensation are intact Psych: euthymic mood, full affect   Recent Labs: 08/24/2017: ALT 27; Hemoglobin 14.8; Platelets 354.0; TSH 1.38 12/14/2017: BUN 12; Creatinine, Ser 0.87; Potassium 3.9; Sodium 140    Lipid Panel Lab Results  Component Value Date   CHOL 189 12/01/2017   HDL 62.50 12/01/2017   LDLCALC 107 (H) 12/01/2017   TRIG 98.0 12/01/2017      Wt Readings from Last 3 Encounters:  06/13/18 140 lb 12 oz (63.8 kg)  12/06/17 138 lb (62.6 kg)  08/16/17 137 lb (62.1 kg)       ASSESSMENT AND PLAN:  Essential hypertension - Plan: EKG 12-Lead Blood pressure is well controlled on today's visit. No changes made to the medications.  Chest pain, unspecified type - Plan: EKG 12-Lead Atypical chest pain, no further work-up needed at this time We did discuss if symptoms persist or get worse we would order CT coronary calcium scoring Prior cardiac work-up negative  Mixed hyperlipidemia Cholesterol is at goal on the current lipid regimen. No changes to the medications were made.   Disposition:   F/U as needed   Total encounter time more than 25 minutes  Greater than 50% was spent in counseling and coordination of care with the patient    Orders Placed This Encounter  Procedures  . EKG 12-Lead     Signed, Esmond Plants, M.D., Ph.D. 06/13/2018  [place], [place]

## 2018-06-13 ENCOUNTER — Encounter: Payer: Self-pay | Admitting: Cardiovascular Disease

## 2018-06-13 ENCOUNTER — Ambulatory Visit: Payer: 59 | Admitting: Cardiovascular Disease

## 2018-06-13 VITALS — BP 130/82 | HR 59 | Ht 62.0 in | Wt 140.8 lb

## 2018-06-13 DIAGNOSIS — E782 Mixed hyperlipidemia: Secondary | ICD-10-CM | POA: Diagnosis not present

## 2018-06-13 DIAGNOSIS — R079 Chest pain, unspecified: Secondary | ICD-10-CM

## 2018-06-13 DIAGNOSIS — I10 Essential (primary) hypertension: Secondary | ICD-10-CM

## 2018-06-13 NOTE — Patient Instructions (Signed)
Medication Instructions:  No changes  If you need a refill on your cardiac medications before your next appointment, please call your pharmacy.    Lab work: No new labs needed   If you have labs (blood work) drawn today and your tests are completely normal, you will receive your results only by: Marland Kitchen MyChart Message (if you have MyChart) OR . A paper copy in the mail If you have any lab test that is abnormal or we need to change your treatment, we will call you to review the results.   Testing/Procedures: No new testing needed  Call if you would ever like to order the CT coronary calcium score  [place], $150   Follow-Up: At [facility], you and your health needs are our priority.  As part of our continuing mission to provide you with exceptional heart care, we have created designated Provider Care Teams.  These Care Teams include your primary Cardiologist (physician) and Advanced Practice Providers (APPs -  Physician Assistants and Nurse Practitioners) who all work together to provide you with the care you need, when you need it.  . You will need a follow up appointment as needed  . Providers on your designated Care Team:   . Murray Hodgkins, NP . Christell Faith, PA-C . Marrianne Mood, PA-C  Any Other Special Instructions Will Be Listed Below (If Applicable).  For educational health videos Log in to : [email or web address] Or : [email or web address], password : [email or web address]

## 2018-07-26 ENCOUNTER — Encounter: Payer: Self-pay | Admitting: Internal Medicine

## 2018-07-26 ENCOUNTER — Telehealth: Payer: Self-pay | Admitting: Internal Medicine

## 2018-07-26 NOTE — Telephone Encounter (Signed)
Pt dropped off a Cpe form to be filled out. Placed in Dr. Olivia Mackie color folder upfront

## 2018-07-26 NOTE — Telephone Encounter (Signed)
Form placed in provider folder.

## 2018-09-05 ENCOUNTER — Encounter

## 2018-09-05 ENCOUNTER — Ambulatory Visit: Payer: 59 | Admitting: Internal Medicine

## 2018-09-24 ENCOUNTER — Encounter: Payer: Self-pay | Admitting: Obstetrics & Gynecology

## 2018-10-04 ENCOUNTER — Other Ambulatory Visit: Payer: Self-pay

## 2018-10-04 ENCOUNTER — Other Ambulatory Visit (HOSPITAL_COMMUNITY)
Admission: RE | Admit: 2018-10-04 | Discharge: 2018-10-04 | Disposition: A | Discharge status: Home / Self Care | Payer: 59 | Source: Ambulatory Visit | Attending: Obstetrics & Gynecology | Admitting: Obstetrics & Gynecology

## 2018-10-04 ENCOUNTER — Ambulatory Visit: Payer: 59 | Admitting: Obstetrics & Gynecology

## 2018-10-04 ENCOUNTER — Encounter: Payer: Self-pay | Admitting: Obstetrics & Gynecology

## 2018-10-04 VITALS — BP 158/96 | HR 60 | Resp 14 | Ht 62.5 in | Wt 140.0 lb

## 2018-10-04 DIAGNOSIS — N816 Rectocele: Secondary | ICD-10-CM | POA: Diagnosis not present

## 2018-10-04 DIAGNOSIS — Z01419 Encounter for gynecological examination (general) (routine) without abnormal findings: Secondary | ICD-10-CM | POA: Diagnosis not present

## 2018-10-04 DIAGNOSIS — Z124 Encounter for screening for malignant neoplasm of cervix: Secondary | ICD-10-CM

## 2018-10-04 NOTE — Progress Notes (Signed)
58 y.o. G43P2002 Married [place] female here for new patient annual exam.  She is also having some prolapse symptoms.  She feels a vaginal bulge that she notes in worse when she has constipation issues.  She has been doing Kegel exercises and has been drinking a tea that helps with constipation.  Reports that last year she had a lot more constipation.  She feels the constipation makes symptoms worse but now with constipation under better control, the prolapse is improved.  Has also been doing Kegel exercises.  Is interested in surgical repair if indicated.   Denies vaginal bleeding.    Patient's last menstrual period was 08/01/2009 (approximate).          Sexually active: No.  The current method of family planning is post menopausal status.    Exercising: Yes.    walking Smoker:  no  Health Maintenance: Pap:  10/14/14 Neg. HR HPV:neg  History of abnormal Pap:  no MMG:  01/13/17 BIRADS1:Neg  Colonoscopy:  2012 Normal. [facility]  BMD: unsure  TDaP:  2019 Pneumonia vaccine(s):  n/a Shingrix:   D/w pt today Hep C testing: 12/09/16 Neg  Screening Labs: PCP   reports that she has never smoked. She has never used smokeless tobacco. She reports that she does not drink alcohol or use drugs.  Past Medical History:  Diagnosis Date  . Anemia   . Hyperlipidemia   . Hypertension     Past Surgical History:  Procedure Laterality Date  . TUBAL LIGATION      Current Outpatient Medications  Medication Sig Dispense Refill  . amLODipine (NORVASC) 5 MG tablet TAKE 1 TABLET (5 MG TOTAL) BY MOUTH DAILY. 90 tablet 3  . aspirin 81 MG chewable tablet Chew 81 mg by mouth daily.    . Multiple Vitamin (MULTIVITAMIN WITH MINERALS) TABS tablet Take 1 tablet by mouth daily.    . Omega-3 Fatty Acids (FISH OIL) 1000 MG CAPS Take one by mouth daily    . pravastatin (PRAVACHOL) 40 MG tablet Take 1 tablet (40 mg total) by mouth at bedtime. 90 tablet 3   No current facility-administered medications for  this visit.     Family History  Problem Relation Age of Onset  . Hyperlipidemia Mother   . Hypertension Mother   . Aneurysm Mother   . Hyperlipidemia Father   . Breast cancer Neg Hx     Review of Systems  All other systems reviewed and are negative.   Exam:   BP (!) 158/96 (BP Location: Left Arm, Patient Position: Sitting, Cuff Size: Normal)   Pulse 60   Resp 14   Ht 5' 2.5" (1.588 m)   Wt 140 lb (63.5 kg)   LMP 08/01/2009 (Approximate)   BMI 25.20 kg/m     Height: 5' 2.5" (158.8 cm)  Ht Readings from Last 3 Encounters:  10/04/18 5' 2.5" (1.588 m)  06/13/18 5\' 2"  (1.575 m)  12/06/17 5\' 2"  (1.575 m)    General appearance: alert, cooperative and appears stated age Head: Normocephalic, without obvious abnormality, atraumatic Neck: no adenopathy, supple, symmetrical, trachea midline and thyroid normal to inspection and palpation Lungs: clear to auscultation bilaterally Breasts: normal appearance, no masses or tenderness Heart: regular rate and rhythm Abdomen: soft, non-tender; bowel sounds normal; no masses,  no organomegaly Extremities: extremities normal, atraumatic, no cyanosis or edema Skin: Skin color, texture, turgor normal. No rashes or lesions Lymph nodes: Cervical, supraclavicular, and axillary nodes normal. No abnormal inguinal nodes palpated  Neurologic: Grossly normal   Pelvic: External genitalia:  no lesions              Urethra:  normal appearing urethra with no masses, tenderness or lesions              Bartholins and Skenes: normal                 Vagina: normal appearing vagina with normal color and discharge, no lesions, second degree rectocele (that is not worse despite valsalva maneuvers)              Cervix: no lesions              Pap taken: Yes.   Bimanual Exam:  Uterus:  normal size, contour, position, consistency, mobility, non-tender              Adnexa: normal adnexa and no mass, fullness, tenderness               Rectovaginal: Confirms                Anus:  normal sphincter tone, no lesions  Chaperone was present for exam.  A:  Well Woman with normal exam PMP, no HRT 2nd degree rectocele on exam today, described as 3rd of 4th degree.  With exam today, feel this can be monitored but she is desirous of repair.  Will refer for second opinion.  P:   Mammogram guidelines reviewed pap smear with HR HPV obtained today Lab work is UTD with PCP (she is fasting today but last blood work was 11/2017 so not needed yet) Shingrix vaccination discussed.  Aware to get at local pharmacy. Release of records for colonoscopy signed as [place] has three different dates for date of colonoscopy Referral to Dr. Matilde Sprang for possible surgical consultation Return annually or prn

## 2018-10-08 ENCOUNTER — Other Ambulatory Visit: Payer: Self-pay | Admitting: Internal Medicine

## 2018-10-08 ENCOUNTER — Other Ambulatory Visit: Payer: Self-pay | Admitting: Physical Medicine and Rehabilitation

## 2018-10-08 DIAGNOSIS — Z1231 Encounter for screening mammogram for malignant neoplasm of breast: Secondary | ICD-10-CM

## 2018-10-08 LAB — CYTOLOGY - PAP
DIAGNOSIS: NEGATIVE
HPV (WINDOPATH): NOT DETECTED

## 2018-11-13 ENCOUNTER — Encounter: Payer: Self-pay | Admitting: Internal Medicine

## 2018-11-14 ENCOUNTER — Other Ambulatory Visit: Payer: Self-pay | Admitting: Internal Medicine

## 2018-11-14 ENCOUNTER — Encounter (INDEPENDENT_AMBULATORY_CARE_PROVIDER_SITE_OTHER): Payer: 59 | Admitting: Internal Medicine

## 2018-11-14 DIAGNOSIS — I10 Essential (primary) hypertension: Secondary | ICD-10-CM

## 2018-11-14 MED ORDER — HYDROCHLOROTHIAZIDE 12.5 MG PO TABS: 12.5000 mg | ORAL_TABLET | Freq: Every day | ORAL | 1 refills | Status: DC

## 2018-11-14 MED ORDER — AMLODIPINE BESYLATE 5 MG PO TABS: ORAL_TABLET | ORAL | 1 refills | Status: DC

## 2018-11-14 NOTE — Telephone Encounter (Addendum)
My chart message  Dr Aundra Dubin;  I think I should increase the dose of my blood pressure medicine. It looks like not as effective as used to be. I don't know it might be because my stress level is higher than usual.  My blood pressure is above 139/89 lots of time.  Should I take 10mg  once a day or 5mg  twice a day?  Please order a new prescription for me. Let me know if you order it through [facility] [place] or Mail Order.   Thank you,  Joan Morgan   Dr. Kelly Services reply 10 mg of amlodipine/norvasc can cause leg swelling.  We can try this but wanted to make you aware  Do you want to try this or add a fluid pill like hydrochlorthiazide in the morning low dose I.e 12.5 mg daily with norvasc 5 mg daily?   Reduce salt intake and caffeine if possible and processed foods.   Of note due to this being not and office visit and making decisions via my chart we are billing for my chart messages   Take care and be safe  TMS     I would prefer hydrochlorthiazide because last month my legs were swollen but now is ok since I walk more and watch what I eat.    Thank you,  Joan Morgan      I will sent hctz 12.5 to take with norvasc 5 mg daily    Time spent 5 minutes and are billable my chart encounters [place]

## 2019-01-24 ENCOUNTER — Telehealth: Payer: Self-pay

## 2019-01-24 NOTE — Telephone Encounter (Signed)
Copied from [place] [id number]. Topic: Appointment Scheduling - Transfer of Care >> Jan 24, 2019  1:24 PM Reyne Dumas L wrote: Pt is requesting to transfer FROM: Dr. Terese Door Pt is requesting to transfer TO: Dr. Deborra Medina Reason for requested transfer: has moved to [facility] and prefers to see original doctor, Dr. Deborra Medina  Send CRM to patient's current PCP (transferring FROM).

## 2019-01-29 ENCOUNTER — Telehealth: Payer: Self-pay | Admitting: Obstetrics & Gynecology

## 2019-01-29 NOTE — Telephone Encounter (Signed)
Dr. Sabra Heck,   Per [facility] they have left multiple message and the patient has not returned at call for scheduling. They have canceled the referral.   Basilia Jumbo

## 2019-02-05 MED FILL — AMOXICILLIN 500 MG CAPSULE: 500 | 9 days supply | Qty: 30 | Fill #0

## 2019-02-06 ENCOUNTER — Encounter: Payer: Self-pay | Admitting: Internal Medicine

## 2019-02-06 ENCOUNTER — Other Ambulatory Visit: Payer: Self-pay | Admitting: Internal Medicine

## 2019-02-06 ENCOUNTER — Telehealth: Payer: Self-pay | Admitting: Internal Medicine

## 2019-02-06 ENCOUNTER — Ambulatory Visit
Admission: RE | Admit: 2019-02-06 | Discharge: 2019-02-06 | Disposition: A | Discharge status: Home / Self Care | Payer: 59 | Source: Ambulatory Visit | Attending: Internal Medicine | Admitting: Internal Medicine

## 2019-02-06 DIAGNOSIS — Z1231 Encounter for screening mammogram for malignant neoplasm of breast: Secondary | ICD-10-CM | POA: Insufficient documentation

## 2019-02-06 DIAGNOSIS — E785 Hyperlipidemia, unspecified: Secondary | ICD-10-CM

## 2019-02-06 DIAGNOSIS — R7303 Prediabetes: Secondary | ICD-10-CM

## 2019-02-06 DIAGNOSIS — I10 Essential (primary) hypertension: Secondary | ICD-10-CM

## 2019-02-06 DIAGNOSIS — Z1329 Encounter for screening for other suspected endocrine disorder: Secondary | ICD-10-CM

## 2019-02-06 DIAGNOSIS — Z1389 Encounter for screening for other disorder: Secondary | ICD-10-CM

## 2019-02-06 NOTE — Telephone Encounter (Signed)
Call pt to schedule fasting labs before her next appt  Thanks tMS  Dr. Aundra Dubin,  Could I have blood work prior to physical appointment so we can discuss the test result on the visit day.  Would you also order Hgb A1C since my fasting glucose was on the border line.    Thank you,  Joan Morgan

## 2019-02-14 ENCOUNTER — Encounter: Payer: Self-pay | Admitting: Internal Medicine

## 2019-02-20 ENCOUNTER — Other Ambulatory Visit (INDEPENDENT_AMBULATORY_CARE_PROVIDER_SITE_OTHER): Payer: 59

## 2019-02-20 DIAGNOSIS — E785 Hyperlipidemia, unspecified: Secondary | ICD-10-CM

## 2019-02-20 DIAGNOSIS — I10 Essential (primary) hypertension: Secondary | ICD-10-CM | POA: Diagnosis not present

## 2019-02-20 DIAGNOSIS — Z1389 Encounter for screening for other disorder: Secondary | ICD-10-CM | POA: Diagnosis not present

## 2019-02-20 DIAGNOSIS — Z1329 Encounter for screening for other suspected endocrine disorder: Secondary | ICD-10-CM | POA: Diagnosis not present

## 2019-02-20 DIAGNOSIS — R7303 Prediabetes: Secondary | ICD-10-CM

## 2019-02-20 LAB — CBC WITH DIFFERENTIAL/PLATELET
Basophils Absolute: 0.1 10*3/uL (ref 0.0–0.1)
Basophils Relative: 0.8 % (ref 0.0–3.0)
Eosinophils Absolute: 0.1 10*3/uL (ref 0.0–0.7)
Eosinophils Relative: 2.1 % (ref 0.0–5.0)
HCT: 42.7 % (ref 36.0–46.0)
Hemoglobin: 14 g/dL (ref 12.0–15.0)
Lymphocytes Relative: 33.9 % (ref 12.0–46.0)
Lymphs Abs: 2.2 10*3/uL (ref 0.7–4.0)
MCHC: 32.7 g/dL (ref 30.0–36.0)
MCV: 87.2 fl (ref 78.0–100.0)
Monocytes Absolute: 0.4 10*3/uL (ref 0.1–1.0)
Monocytes Relative: 5.5 % (ref 3.0–12.0)
Neutro Abs: 3.8 10*3/uL (ref 1.4–7.7)
Neutrophils Relative %: 57.7 % (ref 43.0–77.0)
Platelets: 327 10*3/uL (ref 150.0–400.0)
RBC: 4.9 Mil/uL (ref 3.87–5.11)
RDW: 14 % (ref 11.5–15.5)
WBC: 6.5 10*3/uL (ref 4.0–10.5)

## 2019-02-20 LAB — LIPID PANEL
Cholesterol: 204 mg/dL — ABNORMAL HIGH (ref 0–200)
HDL: 64.4 mg/dL (ref >39.00)
LDL Cholesterol: 119 mg/dL — ABNORMAL HIGH (ref 0–99)
NonHDL: 139.61
Total CHOL/HDL Ratio: 3
Triglycerides: 101 mg/dL (ref 0.0–149.0)
VLDL: 20.2 mg/dL (ref 0.0–40.0)

## 2019-02-20 LAB — COMPREHENSIVE METABOLIC PANEL
ALT: 21 U/L (ref 0–35)
AST: 22 U/L (ref 0–37)
Albumin: 4.6 g/dL (ref 3.5–5.2)
Alkaline Phosphatase: 76 U/L (ref 39–117)
BUN: 18 mg/dL (ref 6–23)
CO2: 26 mEq/L (ref 19–32)
Calcium: 10 mg/dL (ref 8.4–10.5)
Chloride: 106 mEq/L (ref 96–112)
Creatinine, Ser: 0.82 mg/dL (ref 0.40–1.20)
GFR: 71.23 mL/min (ref >60.00)
Glucose, Bld: 106 mg/dL — ABNORMAL HIGH (ref 70–99)
Potassium: 3.6 mEq/L (ref 3.5–5.1)
Sodium: 142 mEq/L (ref 135–145)
Total Bilirubin: 0.5 mg/dL (ref 0.2–1.2)
Total Protein: 7.9 g/dL (ref 6.0–8.3)

## 2019-02-20 LAB — T4, FREE: Free T4: 0.79 ng/dL (ref 0.60–1.60)

## 2019-02-20 LAB — HEMOGLOBIN A1C: Hgb A1c MFr Bld: 6.1 % (ref 4.6–6.5)

## 2019-02-20 LAB — TSH: TSH: 1.8 u[IU]/mL (ref 0.35–4.50)

## 2019-02-21 ENCOUNTER — Telehealth: Payer: Self-pay

## 2019-02-21 LAB — URINALYSIS, ROUTINE W REFLEX MICROSCOPIC
Bilirubin Urine: NEGATIVE
Glucose, UA: NEGATIVE
Hgb urine dipstick: NEGATIVE
Ketones, ur: NEGATIVE
Leukocytes,Ua: NEGATIVE
Nitrite: NEGATIVE
Protein, ur: NEGATIVE
Specific Gravity, Urine: 1.006 (ref 1.001–1.03)
pH: 7 (ref 5.0–8.0)

## 2019-02-21 NOTE — Telephone Encounter (Signed)
Need order for pt labs please and thank you.

## 2019-02-21 NOTE — Telephone Encounter (Signed)
Lab appt cancelled for 02/22/19.

## 2019-02-21 NOTE — Telephone Encounter (Signed)
Copied from [place] [id number]. Topic: Quick Communication - Appointment Cancellation >> Feb 21, 2019  2:02 PM Rainey Pines A wrote: Patient called to cancel appointment scheduled for 02/22/2019. Patient got blood work done yesterday

## 2019-02-21 NOTE — Telephone Encounter (Signed)
This is duplicate. Closing patient had lab appointment already.

## 2019-02-22 ENCOUNTER — Other Ambulatory Visit: Payer: 59

## 2019-02-25 ENCOUNTER — Other Ambulatory Visit: Payer: Self-pay

## 2019-02-27 ENCOUNTER — Encounter: Payer: 59 | Admitting: Internal Medicine

## 2019-02-28 ENCOUNTER — Encounter: Payer: Self-pay | Admitting: Internal Medicine

## 2019-03-01 ENCOUNTER — Other Ambulatory Visit: Payer: Self-pay | Admitting: Internal Medicine

## 2019-03-01 DIAGNOSIS — E785 Hyperlipidemia, unspecified: Secondary | ICD-10-CM

## 2019-03-01 MED ORDER — ATORVASTATIN CALCIUM 20 MG PO TABS: 20.0000 mg | ORAL_TABLET | Freq: Every day | ORAL | 3 refills | Status: DC

## 2019-03-02 MED FILL — ATORVASTATIN 20 MG TABLET: 20 | 90 days supply | Qty: 90 | Fill #0

## 2019-03-28 DIAGNOSIS — H2513 Age-related nuclear cataract, bilateral: Secondary | ICD-10-CM | POA: Diagnosis not present

## 2019-03-28 DIAGNOSIS — H04123 Dry eye syndrome of bilateral lacrimal glands: Secondary | ICD-10-CM | POA: Diagnosis not present

## 2019-03-28 DIAGNOSIS — H524 Presbyopia: Secondary | ICD-10-CM | POA: Diagnosis not present

## 2019-04-17 ENCOUNTER — Encounter: Payer: 59 | Admitting: Internal Medicine

## 2019-04-28 ENCOUNTER — Encounter: Payer: Self-pay | Admitting: Internal Medicine

## 2019-04-28 DIAGNOSIS — I10 Essential (primary) hypertension: Secondary | ICD-10-CM

## 2019-04-29 MED ORDER — HYDROCHLOROTHIAZIDE 12.5 MG PO TABS: 12.5000 mg | ORAL_TABLET | Freq: Every day | ORAL | 1 refills | Status: DC

## 2019-06-02 MED FILL — ATORVASTATIN 20 MG TABLET: 20 | 90 days supply | Qty: 90 | Fill #1

## 2019-06-15 ENCOUNTER — Encounter: Payer: Self-pay | Admitting: Internal Medicine

## 2019-06-18 ENCOUNTER — Telehealth: Payer: Self-pay | Admitting: Internal Medicine

## 2019-06-18 ENCOUNTER — Other Ambulatory Visit: Payer: Self-pay

## 2019-06-18 NOTE — Telephone Encounter (Signed)
[name] does [facility] cover virtual CPE? If yes please schedule [name] virtual if no will need in person CPE please inform pt [name]  Thanks    Is my Annual Physical appointment can be done as Visual Visit? If Visual, I need to reschedule the appointment because I am working and will not be home until after 1630. I am OFF on 11/24-25, 12/7-8. You can schedule me anytime on my OFF days.  If the appointment on 11/18 at 1530 is still ON, please let me know, so I can leave early from work. I am working at [employer] now and live in [place].  Your quick response would be greatly appreciated.  Thank you, Joan Morgan

## 2019-06-18 NOTE — Telephone Encounter (Signed)
I called pt twice and left a vm to call ofc. °

## 2019-06-19 ENCOUNTER — Other Ambulatory Visit: Payer: Self-pay

## 2019-06-19 ENCOUNTER — Ambulatory Visit (INDEPENDENT_AMBULATORY_CARE_PROVIDER_SITE_OTHER): Payer: 59 | Admitting: Internal Medicine

## 2019-06-19 ENCOUNTER — Encounter: Payer: Self-pay | Admitting: Internal Medicine

## 2019-06-19 VITALS — BP 138/80 | HR 65 | Temp 97.2°F | Ht 63.0 in | Wt 145.4 lb

## 2019-06-19 DIAGNOSIS — I10 Essential (primary) hypertension: Secondary | ICD-10-CM | POA: Diagnosis not present

## 2019-06-19 DIAGNOSIS — G8929 Other chronic pain: Secondary | ICD-10-CM

## 2019-06-19 DIAGNOSIS — M25561 Pain in right knee: Secondary | ICD-10-CM | POA: Diagnosis not present

## 2019-06-19 DIAGNOSIS — R7303 Prediabetes: Secondary | ICD-10-CM | POA: Diagnosis not present

## 2019-06-19 DIAGNOSIS — M25562 Pain in left knee: Secondary | ICD-10-CM | POA: Diagnosis not present

## 2019-06-19 DIAGNOSIS — M79671 Pain in right foot: Secondary | ICD-10-CM

## 2019-06-19 DIAGNOSIS — Z0000 Encounter for general adult medical examination without abnormal findings: Secondary | ICD-10-CM

## 2019-06-19 MED ORDER — HYDROCHLOROTHIAZIDE 12.5 MG PO TABS: 12.5000 mg | ORAL_TABLET | Freq: Every day | ORAL | 3 refills | Status: DC

## 2019-06-19 MED ORDER — AMLODIPINE BESYLATE 2.5 MG PO TABS: ORAL_TABLET | ORAL | 3 refills | Status: DC

## 2019-06-19 MED FILL — AMLODIPINE 2.5 MG TABLET: 2.5 | 90 days supply | Qty: 90 | Fill #0

## 2019-06-19 NOTE — Progress Notes (Signed)
Chief Complaint  Patient presents with  . Annual Exam   Annual  1. C/l b/l knee pain right lateral knee pain >left mild nothing tried x months  2. HTN not taking norvasc 5 mg qd taking hctz 12.5 mg qd BP 120s/upper 70s at home today elevated  3. C/o right heel pain with long hours standing wears insoles already nothing else tried    Review of Systems  Constitutional: Negative for weight loss.  HENT: Negative for hearing loss.   Eyes: Negative for blurred vision.  Respiratory: Negative for shortness of breath.   Cardiovascular: Negative for chest pain.  Gastrointestinal: Negative for abdominal pain.  Musculoskeletal: Positive for joint pain.  Skin: Negative for rash.  Neurological: Negative for headaches.  Psychiatric/Behavioral: Negative for depression.   Past Medical History:  Diagnosis Date  . Anemia   . Hyperlipidemia   . Hypertension   . Prediabetes    Past Surgical History:  Procedure Laterality Date  . TUBAL LIGATION     Family History  Problem Relation Age of Onset  . Hyperlipidemia Mother   . Hypertension Mother   . Aneurysm Mother   . Hyperlipidemia Father   . Breast cancer Neg Hx    Social History   Socioeconomic History  . Marital status: Married    Spouse name: Not on file  . Number of children: Not on file  . Years of education: Not on file  . Highest education level: Not on file  Occupational History  . Not on file  Social Needs  . Financial resource strain: Not on file  . Food insecurity    Worry: Not on file    Inability: Not on file  . Transportation needs    Medical: Not on file    Non-medical: Not on file  Tobacco Use  . Smoking status: Never Smoker  . Smokeless tobacco: Never Used  Substance and Sexual Activity  . Alcohol use: No  . Drug use: No  . Sexual activity: Not Currently    Birth control/protection: Post-menopausal  Lifestyle  . Physical activity    Days per week: Not on file    Minutes per session: Not on file  .  Stress: Not on file  Relationships  . Social Herbalist on phone: Not on file    Gets together: Not on file    Attends religious service: Not on file    Active member of club or organization: Not on file    Attends meetings of clubs or organizations: Not on file    Relationship status: Not on file  . Intimate partner violence    Fear of current or ex partner: Not on file    Emotionally abused: Not on file    Physically abused: Not on file    Forced sexual activity: Not on file  Other Topics Concern  . Not on file  Social History Narrative   From [place].   Married, 2 sons ages 69 and 40 yo.   Works as Quarry manager at [employer].   Current Meds  Medication Sig  . aspirin 81 MG chewable tablet Chew 81 mg by mouth daily.  Marland Kitchen atorvastatin (LIPITOR) 20 MG tablet Take 1 tablet (20 mg total) by mouth daily at 6 PM.  . hydrochlorothiazide (HYDRODIURIL) 12.5 MG tablet Take 1 tablet (12.5 mg total) by mouth daily. In am  . Multiple Vitamin (MULTIVITAMIN WITH MINERALS) TABS tablet Take 1 tablet by mouth daily.  . Omega-3 Fatty Acids (  FISH OIL) 1000 MG CAPS Take one by mouth daily  . [DISCONTINUED] hydrochlorothiazide (HYDRODIURIL) 12.5 MG tablet Take 1 tablet (12.5 mg total) by mouth daily. In am   Allergies  Allergen Reactions  . Ace Inhibitors     cough   No results found for this or any previous visit (from the past 2160 hour(s)). Objective  Body mass index is 25.76 kg/m. Wt Readings from Last 3 Encounters:  06/19/19 145 lb 6.4 oz (66 kg)  10/04/18 140 lb (63.5 kg)  06/13/18 140 lb 12 oz (63.8 kg)   Temp Readings from Last 3 Encounters:  06/19/19 (!) 97.2 F (36.2 C) (Temporal)  12/06/17 97.8 F (36.6 C) (Oral)  08/16/17 98.6 F (37 C) (Oral)   BP Readings from Last 3 Encounters:  06/19/19 138/80  10/04/18 (!) 158/96  06/13/18 130/82   Pulse Readings from Last 3 Encounters:  06/19/19 65  10/04/18 60  06/13/18 (!) 59    Physical Exam Vitals signs  and nursing note reviewed.  Constitutional:      Appearance: Normal appearance. She is well-developed and well-groomed.     Comments: +mask on    HENT:     Head: Normocephalic and atraumatic.  Eyes:     Conjunctiva/sclera: Conjunctivae normal.     Pupils: Pupils are equal, round, and reactive to light.  Cardiovascular:     Rate and Rhythm: Normal rate and regular rhythm.     Heart sounds: Normal heart sounds. No murmur.  Pulmonary:     Effort: Pulmonary effort is normal.     Breath sounds: Normal breath sounds.  Chest:     Chest wall: No mass.     Breasts: Breasts are symmetrical.        Right: Normal. No swelling, bleeding, inverted nipple, mass, nipple discharge, skin change or tenderness.        Left: Normal. No swelling, bleeding, inverted nipple, mass, nipple discharge, skin change or tenderness.    Abdominal:     General: Abdomen is flat. Bowel sounds are normal.     Tenderness: There is no abdominal tenderness.  Lymphadenopathy:     Upper Body:     Right upper body: No axillary adenopathy.     Left upper body: No axillary adenopathy.  Skin:    General: Skin is warm and dry.  Neurological:     General: No focal deficit present.     Mental Status: She is alert and oriented to person, place, and time. Mental status is at baseline.     Gait: Gait normal.  Psychiatric:        Attention and Perception: Attention and perception normal.        Mood and Affect: Mood and affect normal.        Speech: Speech normal.        Behavior: Behavior normal. Behavior is cooperative.        Thought Content: Thought content normal.        Cognition and Memory: Cognition and memory normal.        Judgment: Judgment normal.     Assessment  Plan  Annual physical exam Had flu shot 05/22/19 Tdap utd Consider and disc shingrix for future declines for now as of 06/19/19  Never smoker  Saw dermatology last year for toenail fungus that never cleared  No issues as of 06/19/19 derm  issues  Breast exam today  Normal mammmo Neg 02/06/2019   pap 10/04/18 neg neg HPV mary miller c/o prolapse  bladder rec call and sch appt with ob/gyn upcoming 09/2019 may call sooner   Colonoscopy need copy [place] done 5-6 years ago [place] noncancerous polpys in dad they rec 5 year repeat pt wants to wait until 2023 -got copy had 08/18/11 normal hemorrhoids repeat in 5 years No FH colon cancer ok to wait 10 years 08/17/2021   Increase exercise doing 4 days a week increase more, also rec change white rice to multigrain or brown rec healthy diet and exercise   dexa age 84   Essential hypertension - Plan: amLODipine (NORVASC) 2.5 MG tablet reduce from 5 mg qd hydrochlorothiazide (HYDRODIURIL) 12.5 MG tablet  Chronic pain of both knees - Plan: DG Knee Complete 4 Views Left, DG Knee Complete 4 Views Right [facility]  Pain of right heel given stretches to do and cont insoles if not better rec podiatry    Provider: Dr. Olivia Mackie McLean-Scocuzza-Internal Medicine

## 2019-06-19 NOTE — Patient Instructions (Addendum)
Call back Dr. Sabra Heck bladder prolapse Pessary     High Cholesterol  High cholesterol is a condition in which the blood has high levels of a white, waxy, fat-like substance (cholesterol). The human body needs small amounts of cholesterol. The liver makes all the cholesterol that the body needs. Extra (excess) cholesterol comes from the food that we eat. Cholesterol is carried from the liver by the blood through the blood vessels. If you have high cholesterol, deposits (plaques) may build up on the walls of your blood vessels (arteries). Plaques make the arteries narrower and stiffer. Cholesterol plaques increase your risk for heart attack and stroke. Work with your health care provider to keep your cholesterol levels in a healthy range. What increases the risk? This condition is more likely to develop in people who:  Eat foods that are high in animal fat (saturated fat) or cholesterol.  Are overweight.  Are not getting enough exercise.  Have a family history of high cholesterol. What are the signs or symptoms? There are no symptoms of this condition. How is this diagnosed? This condition may be diagnosed from the results of a blood test.  If you are older than age 30, your health care provider may check your cholesterol every 4-6 years.  You may be checked more often if you already have high cholesterol or other risk factors for heart disease. The blood test for cholesterol measures:  "Bad" cholesterol (LDL cholesterol). This is the main type of cholesterol that causes heart disease. The desired level for LDL is less than 100.  "Good" cholesterol (HDL cholesterol). This type helps to protect against heart disease by cleaning the arteries and carrying the LDL away. The desired level for HDL is 60 or higher.  Triglycerides. These are fats that the body can store or burn for energy. The desired number for triglycerides is lower than 150.  Total cholesterol. This is a measure of the  total amount of cholesterol in your blood, including LDL cholesterol, HDL cholesterol, and triglycerides. A healthy number is less than 200. How is this treated? This condition is treated with diet changes, lifestyle changes, and medicines. Diet changes  This may include eating more whole grains, fruits, vegetables, nuts, and fish.  This may also include cutting back on red meat and foods that have a lot of added sugar. Lifestyle changes  Changes may include getting at least 40 minutes of aerobic exercise 3 times a week. Aerobic exercises include walking, biking, and swimming. Aerobic exercise along with a healthy diet can help you maintain a healthy weight.  Changes may also include quitting smoking. Medicines  Medicines are usually given if diet and lifestyle changes have failed to reduce your cholesterol to healthy levels.  Your health care provider may prescribe a statin medicine. Statin medicines have been shown to reduce cholesterol, which can reduce the risk of heart disease. Follow these instructions at home: Eating and drinking If told by your health care provider:  Eat chicken (without skin), fish, veal, shellfish, ground [place] breast, and round or loin cuts of red meat.  Do not eat fried foods or fatty meats, such as hot dogs and salami.  Eat plenty of fruits, such as apples.  Eat plenty of vegetables, such as broccoli, potatoes, and carrots.  Eat beans, peas, and lentils.  Eat grains such as barley, rice, couscous, and bulgur wheat.  Eat pasta without cream sauces.  Use skim or nonfat milk, and eat low-fat or nonfat yogurt and cheeses.  Do  not eat or drink whole milk, cream, ice cream, egg yolks, or hard cheeses.  Do not eat stick margarine or tub margarines that contain trans fats (also called partially hydrogenated oils).  Do not eat saturated tropical oils, such as coconut oil and palm oil.  Do not eat cakes, cookies, crackers, or other baked goods that  contain trans fats.  General instructions  Exercise as directed by your health care provider. Increase your activity level with activities such as gardening, walking, and taking the stairs.  Take over-the-counter and prescription medicines only as told by your health care provider.  Do not use any products that contain nicotine or tobacco, such as cigarettes and e-cigarettes. If you need help quitting, ask your health care provider.  Keep all follow-up visits as told by your health care provider. This is important. Contact a health care provider if:  You are struggling to maintain a healthy diet or weight.  You need help to start on an exercise program.  You need help to stop smoking. Get help right away if:  You have chest pain.  You have trouble breathing. This information is not intended to replace advice given to you by your health care provider. Make sure you discuss any questions you have with your health care provider. Document Released: 07/18/2005 Document Revised: 07/21/2017 Document Reviewed: 01/16/2016 Elsevier Patient Education  [place].  Cholesterol Content in Foods Cholesterol is a waxy, fat-like substance that helps to carry fat in the blood. The body needs cholesterol in small amounts, but too much cholesterol can cause damage to the arteries and heart. Most people should eat less than 200 milligrams (mg) of cholesterol a day. Foods with cholesterol  Cholesterol is found in animal-based foods, such as meat, seafood, and dairy. Generally, low-fat dairy and lean meats have less cholesterol than full-fat dairy and fatty meats. The milligrams of cholesterol per serving (mg per serving) of common cholesterol-containing foods are listed below. Meat and other proteins  Egg - one large whole egg has 186 mg.  Veal shank - 4 oz has 141 mg.  Lean ground [place] (93% lean) - 4 oz has 118 mg.  Fat-trimmed lamb loin - 4 oz has 106 mg.  Lean ground beef (90%  lean) - 4 oz has 100 mg.  Lobster - 3.5 oz has 90 mg.  Pork loin chops - 4 oz has 86 mg.  Canned salmon - 3.5 oz has 83 mg.  Fat-trimmed beef top loin - 4 oz has 78 mg.  Frankfurter - 1 frank (3.5 oz) has 77 mg.  Crab - 3.5 oz has 71 mg.  Roasted chicken without skin, white meat - 4 oz has 66 mg.  Light bologna - 2 oz has 45 mg.  Deli-cut [place] - 2 oz has 31 mg.  Canned tuna - 3.5 oz has 31 mg.  Bacon - 1 oz has 29 mg.  Oysters and mussels (raw) - 3.5 oz has 25 mg.  Mackerel - 1 oz has 22 mg.  Trout - 1 oz has 20 mg.  Pork sausage - 1 link (1 oz) has 17 mg.  Salmon - 1 oz has 16 mg.  Tilapia - 1 oz has 14 mg. Dairy  Soft-serve ice cream -  cup (4 oz) has 103 mg.  Whole-milk yogurt - 1 cup (8 oz) has 29 mg.  Cheddar cheese - 1 oz has 28 mg.  American cheese - 1 oz has 28 mg.  Whole milk - 1 cup (8  oz) has 23 mg.  2% milk - 1 cup (8 oz) has 18 mg.  Cream cheese - 1 tablespoon (Tbsp) has 15 mg.  Cottage cheese -  cup (4 oz) has 14 mg.  Low-fat (1%) milk - 1 cup (8 oz) has 10 mg.  Sour cream - 1 Tbsp has 8.5 mg.  Low-fat yogurt - 1 cup (8 oz) has 8 mg.  Nonfat Greek yogurt - 1 cup (8 oz) has 7 mg.  Half-and-half cream - 1 Tbsp has 5 mg. Fats and oils  Cod liver oil - 1 tablespoon (Tbsp) has 82 mg.  Butter - 1 Tbsp has 15 mg.  Lard - 1 Tbsp has 14 mg.  Bacon grease - 1 Tbsp has 14 mg.  Mayonnaise - 1 Tbsp has 5-10 mg.  Margarine - 1 Tbsp has 3-10 mg. Exact amounts of cholesterol in these foods may vary depending on specific ingredients and brands. Foods without cholesterol Most plant-based foods do not have cholesterol unless you combine them with a food that has cholesterol. Foods without cholesterol include:  Grains and cereals.  Vegetables.  Fruits.  Vegetable oils, such as olive, canola, and sunflower oil.  Legumes, such as peas, beans, and lentils.  Nuts and seeds.  Egg whites. Summary  The body needs cholesterol in  small amounts, but too much cholesterol can cause damage to the arteries and heart.  Most people should eat less than 200 milligrams (mg) of cholesterol a day. This information is not intended to replace advice given to you by your health care provider. Make sure you discuss any questions you have with your health care provider. Document Released: 03/14/2017 Document Revised: 06/30/2017 Document Reviewed: 03/14/2017 Elsevier Patient Education  [place].  Kegel Exercises  Kegel exercises can help strengthen your pelvic floor muscles. The pelvic floor is a group of muscles that support your rectum, small intestine, and bladder. In females, pelvic floor muscles also help support the womb (uterus). These muscles help you control the flow of urine and stool. Kegel exercises are painless and simple, and they do not require any equipment. Your provider may suggest Kegel exercises to:  Improve bladder and bowel control.  Improve sexual response.  Improve weak pelvic floor muscles after surgery to remove the uterus (hysterectomy) or pregnancy (females).  Improve weak pelvic floor muscles after prostate gland removal or surgery (males). Kegel exercises involve squeezing your pelvic floor muscles, which are the same muscles you squeeze when you try to stop the flow of urine or keep from passing gas. The exercises can be done while sitting, standing, or lying down, but it is best to vary your position. Exercises How to do Kegel exercises: 1. Squeeze your pelvic floor muscles tight. You should feel a tight lift in your rectal area. If you are a female, you should also feel a tightness in your vaginal area. Keep your stomach, buttocks, and legs relaxed. 2. Hold the muscles tight for up to 10 seconds. 3. Breathe normally. 4. Relax your muscles. 5. Repeat as told by your health care provider. Repeat this exercise daily as told by your health care provider. Continue to do this exercise for at  least 4-6 weeks, or for as long as told by your health care provider. You may be referred to a physical therapist who can help you learn more about how to do Kegel exercises. Depending on your condition, your health care provider may recommend:  Varying how long you squeeze your muscles.  Doing several  sets of exercises every day.  Doing exercises for several weeks.  Making Kegel exercises a part of your regular exercise routine. This information is not intended to replace advice given to you by your health care provider. Make sure you discuss any questions you have with your health care provider. Document Released: 07/04/2012 Document Revised: 03/07/2018 Document Reviewed: 03/07/2018 Elsevier Patient Education  [place].  Recombinant Zoster (Shingles) Vaccine: What You Need to Know 1. Why get vaccinated? Recombinant zoster (shingles) vaccine can prevent shingles. Shingles (also called herpes zoster, or just zoster) is a painful skin rash, usually with blisters. In addition to the rash, shingles can cause fever, headache, chills, or upset stomach. More rarely, shingles can lead to pneumonia, hearing problems, blindness, brain inflammation (encephalitis), or death. The most common complication of shingles is long-term nerve pain called postherpetic neuralgia (PHN). PHN occurs in the areas where the shingles rash was, even after the rash clears up. It can last for months or years after the rash goes away. The pain from PHN can be severe and debilitating. About 10 to 18% of people who get shingles will experience PHN. The risk of PHN increases with age. An older adult with shingles is more likely to develop PHN and have longer lasting and more severe pain than a younger person with shingles. Shingles is caused by the varicella zoster virus, the same virus that causes chickenpox. After you have chickenpox, the virus stays in your body and can cause shingles later in life. Shingles cannot  be passed from one person to another, but the virus that causes shingles can spread and cause chickenpox in someone who had never had chickenpox or received chickenpox vaccine. 2. Recombinant shingles vaccine Recombinant shingles vaccine provides strong protection against shingles. By preventing shingles, recombinant shingles vaccine also protects against PHN. Recombinant shingles vaccine is the preferred vaccine for the prevention of shingles. However, a different vaccine, live shingles vaccine, may be used in some circumstances. The recombinant shingles vaccine is recommended for adults 50 years and older without serious immune problems. It is given as a two-dose series. This vaccine is also recommended for people who have already gotten another type of shingles vaccine, the live shingles vaccine. There is no live virus in this vaccine. Shingles vaccine may be given at the same time as other vaccines. 3. Talk with your health care provider Tell your vaccine provider if the person getting the vaccine:  Has had an allergic reaction after a previous dose of recombinant shingles vaccine, or has any severe, life-threatening allergies.  Is pregnant or breastfeeding.  Is currently experiencing an episode of shingles. In some cases, your health care provider may decide to postpone shingles vaccination to a future visit. People with minor illnesses, such as a cold, may be vaccinated. People who are moderately or severely ill should usually wait until they recover before getting recombinant shingles vaccine. Your health care provider can give you more information. 4. Risks of a vaccine reaction  A sore arm with mild or moderate pain is very common after recombinant shingles vaccine, affecting about 80% of vaccinated people. Redness and swelling can also happen at the site of the injection.  Tiredness, muscle pain, headache, shivering, fever, stomach pain, and nausea happen after vaccination in more  than half of people who receive recombinant shingles vaccine. In clinical trials, about 1 out of 6 people who got recombinant zoster vaccine experienced side effects that prevented them from doing regular activities. Symptoms usually went away  on their own in 2 to 3 days. You should still get the second dose of recombinant zoster vaccine even if you had one of these reactions after the first dose. People sometimes faint after medical procedures, including vaccination. Tell your provider if you feel dizzy or have vision changes or ringing in the ears. As with any medicine, there is a very remote chance of a vaccine causing a severe allergic reaction, other serious injury, or death. 5. What if there is a serious problem? An allergic reaction could occur after the vaccinated person leaves the clinic. If you see signs of a severe allergic reaction (hives, swelling of the face and throat, difficulty breathing, a fast heartbeat, dizziness, or weakness), call 9-1-1 and get the person to the nearest hospital. For other signs that concern you, call your health care provider. Adverse reactions should be reported to the Vaccine Adverse Event Reporting System (VAERS). Your health care provider will usually file this report, or you can do it yourself. Visit the VAERS website at www.vaers.SamedayNews.es or call 409-288-7820. VAERS is only for reporting reactions, and VAERS staff do not give medical advice. 6. How can I learn more?  Ask your health care provider.  Call your local or state health department.  Contact the Centers for Disease Control and Prevention (CDC): ? Call 250-845-5717 (1-800-CDC-INFO) or ? Visit CDC's website at http://hunter.com/ Vaccine Information Statement Recombinant Zoster Vaccine (05/30/2018) This information is not intended to replace advice given to you by your health care provider. Make sure you discuss any questions you have with your health care provider. Document Released:  09/27/2016 Document Revised: 11/06/2018 Document Reviewed: 02/21/2018 Elsevier Patient Education  [place].    Plantar Fasciitis  Plantar fasciitis is a painful foot condition that affects the heel. It occurs when the band of tissue that connects the toes to the heel bone (plantar fascia) becomes irritated. This can happen as the result of exercising too much or doing other repetitive activities (overuse injury). The pain from plantar fasciitis can range from mild irritation to severe pain that makes it difficult to walk or move. The pain is usually worse in the morning after sleeping, or after sitting or lying down for a while. Pain may also be worse after long periods of walking or standing. What are the causes? This condition may be caused by:  Standing for long periods of time.  Wearing shoes that do not have good arch support.  Doing activities that put stress on joints (high-impact activities), including running, aerobics, and ballet.  Being overweight.  An abnormal way of walking (gait).  Tight muscles in the back of your lower leg (calf).  High arches in your feet.  Starting a new athletic activity. What are the signs or symptoms? The main symptom of this condition is heel pain. Pain may:  Be worse with first steps after a time of rest, especially in the morning after sleeping or after you have been sitting or lying down for a while.  Be worse after long periods of standing still.  Decrease after 30-45 minutes of activity, such as gentle walking. How is this diagnosed? This condition may be diagnosed based on your medical history and your symptoms. Your health care provider may ask questions about your activity level. Your health care provider will do a physical exam to check for:  A tender area on the bottom of your foot.  A high arch in your foot.  Pain when you move your foot.  Difficulty  moving your foot. You may have imaging tests to confirm the  diagnosis, such as:  X-rays.  Ultrasound.  MRI. How is this treated? Treatment for plantar fasciitis depends on how severe your condition is. Treatment may include:  Rest, ice, applying pressure (compression), and raising the affected foot (elevation). This may be called RICE therapy. Your health care provider may recommend RICE therapy along with over-the-counter pain medicines to manage your pain.  Exercises to stretch your calves and your plantar fascia.  A splint that holds your foot in a stretched, upward position while you sleep (night splint).  Physical therapy to relieve symptoms and prevent problems in the future.  Injections of steroid medicine (cortisone) to relieve pain and inflammation.  Stimulating your plantar fascia with electrical impulses (extracorporeal shock wave therapy). This is usually the last treatment option before surgery.  Surgery, if other treatments have not worked after 12 months. Follow these instructions at home:  Managing pain, stiffness, and swelling  If directed, put ice on the painful area: ? Put ice in a plastic bag, or use a frozen bottle of water. ? Place a towel between your skin and the bag or bottle. ? Roll the bottom of your foot over the bag or bottle. ? Do this for 20 minutes, 2-3 times a day.  Wear athletic shoes that have air-sole or gel-sole cushions, or try wearing soft shoe inserts that are designed for plantar fasciitis.  Raise (elevate) your foot above the level of your heart while you are sitting or lying down. Activity  Avoid activities that cause pain. Ask your health care provider what activities are safe for you.  Do physical therapy exercises and stretches as told by your health care provider.  Try activities and forms of exercise that are easier on your joints (low-impact). Examples include swimming, water aerobics, and biking. General instructions  Take over-the-counter and prescription medicines only as told by  your health care provider.  Wear a night splint while sleeping, if told by your health care provider. Loosen the splint if your toes tingle, become numb, or turn cold and blue.  Maintain a healthy weight, or work with your health care provider to lose weight as needed.  Keep all follow-up visits as told by your health care provider. This is important. Contact a health care provider if you:  Have symptoms that do not go away after caring for yourself at home.  Have pain that gets worse.  Have pain that affects your ability to move or do your daily activities. Summary  Plantar fasciitis is a painful foot condition that affects the heel. It occurs when the band of tissue that connects the toes to the heel bone (plantar fascia) becomes irritated.  The main symptom of this condition is heel pain that may be worse after exercising too much or standing still for a long time.  Treatment varies, but it usually starts with rest, ice, compression, and elevation (RICE therapy) and over-the-counter medicines to manage pain. This information is not intended to replace advice given to you by your health care provider. Make sure you discuss any questions you have with your health care provider. Document Released: 04/12/2001 Document Revised: 06/30/2017 Document Reviewed: 05/15/2017 Elsevier Patient Education  2020 [place].     Pelvic Organ Prolapse Pelvic organ prolapse is the stretching, bulging, or dropping of pelvic organs into an abnormal position. It happens when the muscles and tissues that surround and support pelvic structures become weak or stretched. Pelvic organ  prolapse can involve the:  Vagina (vaginal prolapse).  Uterus (uterine prolapse).  Bladder (cystocele).  Rectum (rectocele).  Intestines (enterocele). When organs other than the vagina are involved, they often bulge into the vagina or protrude from the vagina, depending on how severe the prolapse is. What are the  causes? This condition may be caused by:  Pregnancy, labor, and childbirth.  Past pelvic surgery.  Decreased production of the hormone estrogen associated with menopause.  Consistently lifting more than 50 lb (23 kg).  Obesity.  Long-term inability to pass stool (chronic constipation).  A cough that lasts a long time (chronic).  Buildup of fluid in the abdomen due to certain diseases and other conditions. What are the signs or symptoms? Symptoms of this condition include:  Passing a little urine (loss of bladder control) when you cough, sneeze, strain, and exercise (stress incontinence). This may be worse immediately after childbirth. It may gradually improve over time.  Feeling pressure in your pelvis or vagina. This pressure may increase when you cough or when you are passing stool.  A bulge that protrudes from the opening of your vagina.  Difficulty passing urine or stool.  Pain in your lower back.  Pain, discomfort, or disinterest in sex.  Repeated bladder infections (urinary tract infections).  Difficulty inserting a tampon. In some people, this condition causes no symptoms. How is this diagnosed? This condition may be diagnosed based on a vaginal and rectal exam. During the exam, you may be asked to cough and strain while you are lying down, sitting, and standing up. Your health care provider will determine if other tests are required, such as bladder function tests. How is this treated? Treatment for this condition may depend on your symptoms. Treatment may include:  Lifestyle changes, such as changes to your diet.  Emptying your bladder at scheduled times (bladder training therapy). This can help reduce or avoid urinary incontinence.  Estrogen. Estrogen may help mild prolapse by increasing the strength and tone of pelvic floor muscles.  Kegel exercises. These may help mild cases of prolapse by strengthening and tightening the muscles of the pelvic floor.  A  soft, flexible device that helps support the vaginal walls and keep pelvic organs in place (pessary). This is inserted into your vagina by your health care provider.  Surgery. This is often the only form of treatment for severe prolapse. Follow these instructions at home:  Avoid drinking beverages that contain caffeine or alcohol.  Increase your intake of high-fiber foods. This can help decrease constipation and straining during bowel movements.  Lose weight if recommended by your health care provider.  Wear a sanitary pad or adult diapers if you have urinary incontinence.  Avoid heavy lifting and straining with exercise and work. Do not hold your breath when you perform mild to moderate lifting and exercise activities. Limit your activities as directed by your health care provider.  Do Kegel exercises as directed by your health care provider. To do this: ? Squeeze your pelvic floor muscles tight. You should feel a tight lift in your rectal area and a tightness in your vaginal area. Keep your stomach, buttocks, and legs relaxed. ? Hold the muscles tight for up to 10 seconds. ? Relax your muscles. ? Repeat this exercise 50 times a day, or as many times as told by your health care provider. Continue to do this exercise for at least 4-6 weeks, or for as long as told by your health care provider.  Take over-the-counter and prescription  medicines only as told by your health care provider.  If you have a pessary, take care of it as told by your health care provider.  Keep all follow-up visits as told by your health care provider. This is important. Contact a health care provider if you:  Have symptoms that interfere with your daily activities or sex life.  Need medicine to help with the discomfort.  Notice bleeding from your vagina that is not related to your period.  Have a fever.  Have pain or bleeding when you urinate.  Have bleeding when you pass stool.  Pass urine when you have  sex.  Have chronic constipation.  Have a pessary that falls out.  Have bad smelling vaginal discharge.  Have an unusual, low pain in your abdomen. Summary  Pelvic organ prolapse is the stretching, bulging, or dropping of pelvic organs into an abnormal position. It happens when the muscles and tissues that surround and support pelvic structures become weak or stretched.  When organs other than the vagina are involved, they often bulge into the vagina or protrude from the vagina, depending on how severe the prolapse is.  In most cases, this condition needs to be treated only if it produces symptoms. Treatment may include lifestyle changes, estrogen, Kegel exercises, pessary insertion, or surgery.  Avoid heavy lifting and straining with exercise and work. Do not hold your breath when you perform mild to moderate lifting and exercise activities. Limit your activities as directed by your health care provider. This information is not intended to replace advice given to you by your health care provider. Make sure you discuss any questions you have with your health care provider. Document Released: 02/12/2014 Document Revised: 08/09/2017 Document Reviewed: 08/09/2017 Elsevier Patient Education  2020 [employer].

## 2019-06-19 NOTE — Telephone Encounter (Signed)
Yes, they will cover the CPE if done virtual video.  Thanks, [name]

## 2019-06-19 NOTE — Telephone Encounter (Signed)
Please sch CPE virtually

## 2019-06-20 ENCOUNTER — Encounter: Payer: Self-pay | Admitting: Internal Medicine

## 2019-06-20 DIAGNOSIS — G8929 Other chronic pain: Secondary | ICD-10-CM | POA: Insufficient documentation

## 2019-06-20 DIAGNOSIS — M25562 Pain in left knee: Secondary | ICD-10-CM | POA: Insufficient documentation

## 2019-06-24 ENCOUNTER — Ambulatory Visit
Admission: RE | Admit: 2019-06-24 | Discharge: 2019-06-24 | Disposition: A | Discharge status: Home / Self Care | Payer: 59 | Source: Ambulatory Visit | Attending: Internal Medicine | Admitting: Internal Medicine

## 2019-06-24 DIAGNOSIS — G8929 Other chronic pain: Secondary | ICD-10-CM | POA: Insufficient documentation

## 2019-06-24 DIAGNOSIS — M1711 Unilateral primary osteoarthritis, right knee: Secondary | ICD-10-CM | POA: Diagnosis not present

## 2019-06-24 DIAGNOSIS — M25562 Pain in left knee: Secondary | ICD-10-CM | POA: Insufficient documentation

## 2019-06-24 DIAGNOSIS — M25561 Pain in right knee: Secondary | ICD-10-CM | POA: Insufficient documentation

## 2019-06-24 DIAGNOSIS — M1712 Unilateral primary osteoarthritis, left knee: Secondary | ICD-10-CM | POA: Diagnosis not present

## 2019-06-25 ENCOUNTER — Other Ambulatory Visit: Payer: Self-pay | Admitting: Internal Medicine

## 2019-06-25 DIAGNOSIS — G8929 Other chronic pain: Secondary | ICD-10-CM

## 2019-07-24 ENCOUNTER — Ambulatory Visit: Payer: 59 | Attending: Internal Medicine

## 2019-07-24 DIAGNOSIS — Z20828 Contact with and (suspected) exposure to other viral communicable diseases: Secondary | ICD-10-CM | POA: Diagnosis not present

## 2019-07-24 DIAGNOSIS — Z20822 Contact with and (suspected) exposure to covid-19: Secondary | ICD-10-CM

## 2019-07-26 LAB — NOVEL CORONAVIRUS, NAA: SARS-CoV-2, NAA: NOT DETECTED

## 2019-07-26 MED FILL — HYDROCHLOROTHIAZIDE 12.5 MG: 12.5 | 90 days supply | Qty: 90 | Fill #1

## 2019-07-29 ENCOUNTER — Encounter: Payer: Self-pay | Admitting: Internal Medicine

## 2019-08-05 ENCOUNTER — Telehealth: Payer: Self-pay | Admitting: Obstetrics & Gynecology

## 2019-08-05 ENCOUNTER — Encounter: Payer: Self-pay | Admitting: Obstetrics & Gynecology

## 2019-08-05 NOTE — Telephone Encounter (Signed)
Last AEX 10/04/18  Dr. Sabra Heck -please review MyChart message and advise on surgery patient is requesting.

## 2019-08-05 NOTE — Telephone Encounter (Signed)
Patient sent the following message through [place]. Routing to triage to assist patient with request.  Abramovich, [facility] "[facility] Clinical Pool  Phone Number: [phone]  Dr. Sabra Heck,  I think my pelvic organ prolapse is getting worse. I believe it is time to fix it.  I am more interested to do Surgery to close the vagina (vaginal obliteration).  I have no interest in having sexual intercourse and have not done it for many years.  Is this kind of surgery required me to stay in the hospital or can be done as day surgery?  Please advice.   Thank you,  Joan Morgan

## 2019-08-06 ENCOUNTER — Other Ambulatory Visit: Payer: 59

## 2019-08-06 ENCOUNTER — Other Ambulatory Visit (INDEPENDENT_AMBULATORY_CARE_PROVIDER_SITE_OTHER): Payer: 59

## 2019-08-06 DIAGNOSIS — I10 Essential (primary) hypertension: Secondary | ICD-10-CM | POA: Diagnosis not present

## 2019-08-06 DIAGNOSIS — R7303 Prediabetes: Secondary | ICD-10-CM | POA: Diagnosis not present

## 2019-08-06 LAB — LIPID PANEL
Cholesterol: 193 mg/dL (ref 0–200)
HDL: 57.1 mg/dL (ref >39.00)
LDL Cholesterol: 115 mg/dL — ABNORMAL HIGH (ref 0–99)
NonHDL: 136.36
Total CHOL/HDL Ratio: 3
Triglycerides: 109 mg/dL (ref 0.0–149.0)
VLDL: 21.8 mg/dL (ref 0.0–40.0)

## 2019-08-06 LAB — CBC WITH DIFFERENTIAL/PLATELET
Basophils Absolute: 0.1 10*3/uL (ref 0.0–0.1)
Basophils Relative: 0.9 % (ref 0.0–3.0)
Eosinophils Absolute: 0.1 10*3/uL (ref 0.0–0.7)
Eosinophils Relative: 1.3 % (ref 0.0–5.0)
HCT: 41 % (ref 36.0–46.0)
Hemoglobin: 13.8 g/dL (ref 12.0–15.0)
Lymphocytes Relative: 36.3 % (ref 12.0–46.0)
Lymphs Abs: 2.2 10*3/uL (ref 0.7–4.0)
MCHC: 33.7 g/dL (ref 30.0–36.0)
MCV: 87 fl (ref 78.0–100.0)
Monocytes Absolute: 0.4 10*3/uL (ref 0.1–1.0)
Monocytes Relative: 6.1 % (ref 3.0–12.0)
Neutro Abs: 3.4 10*3/uL (ref 1.4–7.7)
Neutrophils Relative %: 55.4 % (ref 43.0–77.0)
Platelets: 316 10*3/uL (ref 150.0–400.0)
RBC: 4.71 Mil/uL (ref 3.87–5.11)
RDW: 13.5 % (ref 11.5–15.5)
WBC: 6.1 10*3/uL (ref 4.0–10.5)

## 2019-08-06 LAB — COMPREHENSIVE METABOLIC PANEL
ALT: 16 U/L (ref 0–35)
AST: 17 U/L (ref 0–37)
Albumin: 4.4 g/dL (ref 3.5–5.2)
Alkaline Phosphatase: 71 U/L (ref 39–117)
BUN: 15 mg/dL (ref 6–23)
CO2: 29 mEq/L (ref 19–32)
Calcium: 9.6 mg/dL (ref 8.4–10.5)
Chloride: 104 mEq/L (ref 96–112)
Creatinine, Ser: 0.76 mg/dL (ref 0.40–1.20)
GFR: 77.64 mL/min (ref >60.00)
Glucose, Bld: 113 mg/dL — ABNORMAL HIGH (ref 70–99)
Potassium: 3.5 mEq/L (ref 3.5–5.1)
Sodium: 141 mEq/L (ref 135–145)
Total Bilirubin: 0.5 mg/dL (ref 0.2–1.2)
Total Protein: 7.7 g/dL (ref 6.0–8.3)

## 2019-08-06 LAB — HEMOGLOBIN A1C: Hgb A1c MFr Bld: 6.2 % (ref 4.6–6.5)

## 2019-08-07 NOTE — Telephone Encounter (Signed)
Colpocleisis can be done as same day procedure but most patients are going home same day after hysterectomy as well.  Did she see Dr. Matilde Sprang?  I referred her but do not have any notes about visit.  I would advise that 89 is very young to have the procedure done she is describing and that I've never done this on someone as young as her.  We may need OV or telehealth visit to discuss options and her thoughts.

## 2019-08-08 ENCOUNTER — Other Ambulatory Visit: Payer: Self-pay

## 2019-08-08 NOTE — Telephone Encounter (Signed)
She was called multiple times in June for referral by [facility] and she never returned the phone call so the referral was cancelled.  Just want to be clear about that as the referral was placed the day she came in March, 2020, and there was an attempt made to scheduling this for her.  It was early in the Covid pandemic so this may be why she didn't go.  OK to close encounter.

## 2019-08-08 NOTE — Telephone Encounter (Signed)
Spoke with patient. Patient states that she was not contacted about scheduling an appointment with Dr.MacDiarmid. Advised will review with Dr.Miller and see if she recommends another referral be placed for the patient to be seen for evaluation. Patient is agreeable. Would like to schedule an OV to discuss surgery options. Appointment scheduled for 08/12/2019 at 2 pm with Dr.Miller. Patient is agreeable to date and time.

## 2019-08-12 ENCOUNTER — Ambulatory Visit: Payer: 59 | Admitting: Obstetrics & Gynecology

## 2019-08-12 ENCOUNTER — Other Ambulatory Visit: Payer: Self-pay

## 2019-08-12 ENCOUNTER — Encounter: Payer: Self-pay | Admitting: Obstetrics & Gynecology

## 2019-08-12 VITALS — BP 126/72 | HR 72 | Temp 96.8°F | Resp 12 | Wt 145.2 lb

## 2019-08-12 DIAGNOSIS — N8111 Cystocele, midline: Secondary | ICD-10-CM | POA: Diagnosis not present

## 2019-08-12 DIAGNOSIS — N812 Incomplete uterovaginal prolapse: Secondary | ICD-10-CM | POA: Diagnosis not present

## 2019-08-12 NOTE — Progress Notes (Signed)
GYNECOLOGY  VISIT  CC:   Patient states that pelvic organ prolapse is getting worse  HPI: 59 y.o. G31P2002 Married [place] female here for discuss surgery options.  Pt was seen about a year ago and described what sounded like 4th degree uterine prolapse.  She did have a cystocele on exam as well as a small amount of mild uterine prolapse but I could not reproduce on exam what she described. The cystocele was a second degree on exam.  She was referred to [facility] but never returned their calls for appt.  She was living in [place] at the time and desired referral to the [place] location.  Pt state she does not remember that but is was early in the Covid pandemic.  She does not leak urine with cough or sneeze or laugh but feels a bulge that is preset now more often than not.  She can reduce it and it is better in the morning.  Pt has done some research and would like a colpocleisis for surgical correction.  D/w pt this may not be appropriate depending what is found on physical exam and that is it not typically done in someone as young as she is.   Denies vaginal discharge or vaginal bleeding.  Denies pelvic pain.  Does sometimes have a sensation of pressure or fullness.    Last pap smear, reviewed with pt, was 10/04/2018.  Repeat not needed today.  GYNECOLOGIC HISTORY: Patient's last menstrual period was 08/01/2009 (approximate). Contraception: Postmenopausal Menopausal hormone therapy: none  Patient Active Problem List   Diagnosis Date Noted  . Chronic pain of both knees 06/20/2019  . Neck stiffness 12/06/2017  . Constipation 08/16/2017  . Annual physical exam 11/18/2015  . Chest pain 09/30/2015  . HLD (hyperlipidemia) 10/14/2014  . Anemia   . Hypertension     Past Medical History:  Diagnosis Date  . Anemia   . Hyperlipidemia   . Hypertension   . Prediabetes     Past Surgical History:  Procedure Laterality Date  . TUBAL LIGATION      MEDS:   Current Outpatient  Medications on File Prior to Visit  Medication Sig Dispense Refill  . amLODipine (NORVASC) 2.5 MG tablet TAKE 1 TABLET (5 MG TOTAL) BY MOUTH DAILY. 90 tablet 3  . aspirin 81 MG chewable tablet Chew 81 mg by mouth daily.    Marland Kitchen atorvastatin (LIPITOR) 20 MG tablet Take 1 tablet (20 mg total) by mouth daily at 6 PM. 90 tablet 3  . hydrochlorothiazide (HYDRODIURIL) 12.5 MG tablet Take 1 tablet (12.5 mg total) by mouth daily. In am 90 tablet 3  . Multiple Vitamin (MULTIVITAMIN WITH MINERALS) TABS tablet Take 1 tablet by mouth daily.    . Omega-3 Fatty Acids (FISH OIL) 1000 MG CAPS Take one by mouth daily     No current facility-administered medications on file prior to visit.    ALLERGIES: Ace inhibitors  Family History  Problem Relation Age of Onset  . Hyperlipidemia Mother   . Hypertension Mother   . Aneurysm Mother   . Hyperlipidemia Father   . Breast cancer Neg Hx     SH:  Married, non smoker  Review of Systems  All other systems reviewed and are negative.   PHYSICAL EXAMINATION:    BP 126/72 (BP Location: Left Arm, Patient Position: Sitting, Cuff Size: Normal)   Pulse 72   Temp (!) 96.8 F (36 C) (Temporal)   Resp 12   Wt 145 lb 3.2 oz (  65.9 kg)   LMP 08/01/2009 (Approximate)   BMI 25.72 kg/m      General appearance: alert, cooperative and appears stated age Abdomen: soft, non-tender; bowel sounds normal; no masses,  no organomegaly Lymph:  no inguinal LAD noted  Pelvic: External genitalia:  no lesions              Urethra:  normal appearing urethra with no masses, tenderness or lesions              Bartholins and Skenes: normal                 Vagina: normal appearing vagina with normal color and discharge, no lesions, 3rd degree cystocele with this passing through introitus with Valsalva, no urine leakage noted, second degree uterine prolapse noted              Cervix: no lesions              Bimanual Exam:  Uterus:  normal size, contour, position, consistency,  mobility, non-tender              Adnexa: no mass, fullness, tenderness  Chaperone, Terence Lux, CMA, was present for exam.  Assessment: 3rd degree cystocele/4th degree with valsalve 2nd degree uterine prolapse  Plan: Pt is absolutely not interested in a hysterectomy.  As much of her prolapse is a cystocele, feel consideration of cystocele repair alone may be beneficial.  She understands if the uterine prolapse worsens over time, she may need additional surgery in the future.  She is ok with this but just wants an outpatient procedure at this time.  We discussed that colpocleisis is not an appropriate treatment for this right now as there is not enough uterine prolapse present.  All questions answered.   ~35 minutes spent with patient >50% of time was in face to face discussion of above.

## 2019-08-21 DIAGNOSIS — M25562 Pain in left knee: Secondary | ICD-10-CM | POA: Diagnosis not present

## 2019-08-21 DIAGNOSIS — M25561 Pain in right knee: Secondary | ICD-10-CM | POA: Diagnosis not present

## 2019-09-10 DIAGNOSIS — N8111 Cystocele, midline: Secondary | ICD-10-CM | POA: Diagnosis not present

## 2019-09-10 DIAGNOSIS — R351 Nocturia: Secondary | ICD-10-CM | POA: Diagnosis not present

## 2019-09-20 MED FILL — AMLODIPINE 2.5 MG TABLET: 2.5 | 90 days supply | Qty: 90 | Fill #1

## 2019-09-20 MED FILL — ATORVASTATIN 20 MG TABLET: 20 | 90 days supply | Qty: 90 | Fill #2

## 2019-10-15 DIAGNOSIS — R351 Nocturia: Secondary | ICD-10-CM | POA: Diagnosis not present

## 2019-10-15 DIAGNOSIS — R350 Frequency of micturition: Secondary | ICD-10-CM | POA: Diagnosis not present

## 2019-10-31 ENCOUNTER — Encounter: Payer: Self-pay | Admitting: Obstetrics & Gynecology

## 2019-10-31 DIAGNOSIS — R350 Frequency of micturition: Secondary | ICD-10-CM | POA: Diagnosis not present

## 2019-10-31 DIAGNOSIS — N8111 Cystocele, midline: Secondary | ICD-10-CM | POA: Diagnosis not present

## 2019-11-04 ENCOUNTER — Telehealth: Payer: Self-pay | Admitting: Obstetrics & Gynecology

## 2019-11-04 NOTE — Telephone Encounter (Signed)
Patient sent the following correspondence through [place].  Dr. Sabra Heck, Dr.Macdiarmid  sent you the report from my exam and urodynamic test. Please let me know what you think whether I should go ahead with the surgery or use pessary?  Thank you, Joan Morgan

## 2019-11-05 NOTE — Telephone Encounter (Signed)
Copy of [facility] report dated 10/31/19 placed on Dr. Ammie Ferrier desk for review.   Routing to Dr. Sabra Heck

## 2019-11-06 NOTE — Telephone Encounter (Signed)
She indicated she did not want a hysterectomy so was hoping Dr. Matilde Sprang would recommend repair of the bladder along.  It's her decision to make.  We can always start with a pessary and if this doesn't work or she has problems with it, she can always go back and decide to proceed with surgery.  If she wants it fixed and be done, then she should proceed with surgery.  I'll help either way--pessary or surgery.

## 2019-11-06 NOTE — Telephone Encounter (Signed)
Left message to call Takeisha Cianci, RN at [facility] [phone].   

## 2019-11-07 NOTE — Telephone Encounter (Signed)
Patient is returning a call to Jill. °

## 2019-11-07 NOTE — Telephone Encounter (Signed)
Spoke with pt. Pt given recommendations per Dr Sabra Heck as seen below. Pt wanting to discuss options with Dr Sabra Heck before proceeding with either pessary or surgery. OV scheduled 11/18/2019 at 9 am per pt's request of date and time.   Routing to Dr Sabra Heck for review.  Encounter closed.

## 2019-11-18 ENCOUNTER — Ambulatory Visit: Payer: Self-pay | Admitting: Obstetrics & Gynecology

## 2019-11-22 NOTE — Progress Notes (Deleted)
GYNECOLOGY  VISIT  CC:   ***  HPI: 59 y.o. G57P2002 Married [place] female here for consult to discuss surgery vs pessary.  GYNECOLOGIC HISTORY: Patient's last menstrual period was 08/01/2009 (approximate). Contraception: *** Menopausal hormone therapy: ***  Patient Active Problem List   Diagnosis Date Noted  . Chronic pain of both knees 06/20/2019  . Neck stiffness 12/06/2017  . Constipation 08/16/2017  . Annual physical exam 11/18/2015  . Chest pain 09/30/2015  . HLD (hyperlipidemia) 10/14/2014  . Anemia   . Hypertension     Past Medical History:  Diagnosis Date  . Anemia   . Hyperlipidemia   . Hypertension   . Prediabetes     Past Surgical History:  Procedure Laterality Date  . TUBAL LIGATION      MEDS:   Current Outpatient Medications on File Prior to Visit  Medication Sig Dispense Refill  . amLODipine (NORVASC) 2.5 MG tablet TAKE 1 TABLET (5 MG TOTAL) BY MOUTH DAILY. 90 tablet 3  . aspirin 81 MG chewable tablet Chew 81 mg by mouth daily.    Marland Kitchen atorvastatin (LIPITOR) 20 MG tablet Take 1 tablet (20 mg total) by mouth daily at 6 PM. 90 tablet 3  . hydrochlorothiazide (HYDRODIURIL) 12.5 MG tablet Take 1 tablet (12.5 mg total) by mouth daily. In am 90 tablet 3  . Multiple Vitamin (MULTIVITAMIN WITH MINERALS) TABS tablet Take 1 tablet by mouth daily.    . Omega-3 Fatty Acids (FISH OIL) 1000 MG CAPS Take one by mouth daily     No current facility-administered medications on file prior to visit.    ALLERGIES: Ace inhibitors  Family History  Problem Relation Age of Onset  . Hyperlipidemia Mother   . Hypertension Mother   . Aneurysm Mother   . Hyperlipidemia Father   . Breast cancer Neg Hx     SH:  ***  Review of Systems  PHYSICAL EXAMINATION:    LMP 08/01/2009 (Approximate)     General appearance: alert, cooperative and appears stated age Neck: no adenopathy, supple, symmetrical, trachea midline and thyroid {CHL AMB PHY EX THYROID NORM  DEFAULT:[phone]::"normal to inspection and palpation"} CV:  {Exam; heart brief:31539} Lungs:  {pe lungs ob:314451::"clear to auscultation, no wheezes, rales or rhonchi, symmetric air entry"} Breasts: {Exam; breast:13139::"normal appearance, no masses or tenderness"} Abdomen: soft, non-tender; bowel sounds normal; no masses,  no organomegaly Lymph:  no inguinal LAD noted  Pelvic: External genitalia:  no lesions              Urethra:  normal appearing urethra with no masses, tenderness or lesions              Bartholins and Skenes: normal                 Vagina: normal appearing vagina with normal color and discharge, no lesions              Cervix: {CHL AMB PHY EX CERVIX NORM [phone]::"no lesions"}              Bimanual Exam:  Uterus:  {CHL AMB PHY EX UTERUS NORM [phone]::"normal size, contour, position, consistency, mobility, non-tender"}              Adnexa: {CHL AMB PHY EX ADNEXA NO MASS DEFAULT:[phone]::"no mass, fullness, tenderness"}              Rectovaginal: {yes no:314532}.  Confirms.              Anus:  normal sphincter  tone, no lesions  Chaperone, ***Terence Lux, CMA, was present for exam.  Assessment: ***  Plan: ***   ~{NUMBERS; -10-45 JOINT ROM:10287} minutes spent with patient >50% of time was in face to face discussion of above.

## 2019-11-25 ENCOUNTER — Other Ambulatory Visit: Payer: Self-pay

## 2019-11-26 ENCOUNTER — Telehealth: Payer: Self-pay | Admitting: Obstetrics & Gynecology

## 2019-11-26 ENCOUNTER — Ambulatory Visit: Payer: 59 | Admitting: Obstetrics & Gynecology

## 2019-11-26 NOTE — Telephone Encounter (Signed)
Left message regarding upcoming appointment for discuss surgery vs pessary has been canceled and needs to be rescheduled.

## 2019-11-28 NOTE — Progress Notes (Signed)
GYNECOLOGY  VISIT  CC:   Discussion after consultation with Dr. Matilde Sprang  HPI: 59 y.o. G2P2002 Married [place] female here for consult to discuss surgery vs pessary use.  Pt has basically decided that she really wants this fixed and doesn't want to wait any longer.  Has cystocele with incomplete uterine prolapse.  Dr. Matilde Sprang recommended hysterectomy at the same time as bladder repair.  She has questions about this part of the procedure.  TLH with possible BSO discussed.  Risks/benefits of ovary removal/leaving ovaries discussed.  She would prefer to have them removed so would plan BSO.  Length of surgery, hospital stay, work limitations/time out of work discussed.  Pt worked at [employer] and would prefer this being done at [facility] or [facility].  Advised this could be accommodated.  Pt would like ot proceed in June if possible.    GYNECOLOGIC HISTORY: Patient's last menstrual period was 08/01/2009 (approximate). Contraception: post menopausal Menopausal hormone therapy: none  Patient Active Problem List   Diagnosis Date Noted  . Chronic pain of both knees 06/20/2019  . Neck stiffness 12/06/2017  . Constipation 08/16/2017  . Annual physical exam 11/18/2015  . Chest pain 09/30/2015  . HLD (hyperlipidemia) 10/14/2014  . Anemia   . Hypertension     Past Medical History:  Diagnosis Date  . Anemia   . Hyperlipidemia   . Hypertension   . Prediabetes     Past Surgical History:  Procedure Laterality Date  . TUBAL LIGATION      MEDS:   Current Outpatient Medications on File Prior to Visit  Medication Sig Dispense Refill  . amLODipine (NORVASC) 2.5 MG tablet TAKE 1 TABLET (5 MG TOTAL) BY MOUTH DAILY. 90 tablet 3  . aspirin 81 MG chewable tablet Chew 81 mg by mouth daily.    Marland Kitchen atorvastatin (LIPITOR) 20 MG tablet Take 1 tablet (20 mg total) by mouth daily at 6 PM. 90 tablet 3  . hydrochlorothiazide (HYDRODIURIL) 12.5 MG tablet Take 1 tablet (12.5 mg total) by mouth daily. In am 90  tablet 3  . Multiple Vitamin (MULTIVITAMIN WITH MINERALS) TABS tablet Take 1 tablet by mouth daily.    . Omega-3 Fatty Acids (FISH OIL) 1000 MG CAPS Take one by mouth daily    . VITAMIN D PO Take 1,000 Int'l Units by mouth daily.     No current facility-administered medications on file prior to visit.    ALLERGIES: Ace inhibitors  Family History  Problem Relation Age of Onset  . Hyperlipidemia Mother   . Hypertension Mother   . Aneurysm Mother   . Hyperlipidemia Father   . Breast cancer Neg Hx     SH:  Married, non smoker  Review of Systems  Constitutional: Negative.   HENT: Negative.   Eyes: Negative.   Respiratory: Negative.   Cardiovascular: Negative.   Gastrointestinal: Negative.   Endocrine: Negative.   Genitourinary: Negative.   Musculoskeletal: Negative.   Skin: Negative.   Allergic/Immunologic: Negative.   Neurological: Negative.   Psychiatric/Behavioral: Negative.     PHYSICAL EXAMINATION:    BP 110/80   Pulse 64   Temp (!) 97.3 F (36.3 C) (Skin)   Resp 16   Wt 146 lb (66.2 kg)   LMP 08/01/2009 (Approximate)   BMI 25.86 kg/m     General appearance: alert, cooperative and appears stated age No other exam performed today  Assessment: Cystocele with incomplete uterine prolapse  Plan: Will plan to coordinate surgery with Dr. Randal Buba, cystocele repair  with cystoscopy.     ~20 minutes spent in face to face discussion with pt

## 2019-11-29 ENCOUNTER — Encounter: Payer: Self-pay | Admitting: Obstetrics & Gynecology

## 2019-11-29 ENCOUNTER — Other Ambulatory Visit: Payer: Self-pay

## 2019-11-29 ENCOUNTER — Ambulatory Visit: Payer: 59 | Admitting: Obstetrics & Gynecology

## 2019-11-29 VITALS — BP 110/80 | HR 64 | Temp 97.3°F | Resp 16 | Wt 146.0 lb

## 2019-11-29 DIAGNOSIS — N8111 Cystocele, midline: Secondary | ICD-10-CM | POA: Diagnosis not present

## 2019-11-29 DIAGNOSIS — N8110 Cystocele, unspecified: Secondary | ICD-10-CM | POA: Insufficient documentation

## 2019-11-29 DIAGNOSIS — N812 Incomplete uterovaginal prolapse: Secondary | ICD-10-CM | POA: Diagnosis not present

## 2019-12-02 ENCOUNTER — Telehealth: Payer: Self-pay | Admitting: Obstetrics & Gynecology

## 2019-12-02 NOTE — Telephone Encounter (Signed)
Spoke with patient regarding surgery benefits. Patient acknowledges understanding of information presented. Patient is aware that benefits presented are for professional benefits only. Patient is aware that once surgery is scheduled, the hospital will call with separate benefits. Patient is aware of surgery cancellation policy. ° °Patient is ready to proceed with scheduling. °

## 2019-12-02 NOTE — Telephone Encounter (Signed)
Left a message for Pam at [facility] to discuss combination surgery scheduling with Dr.MacDiarmid.

## 2019-12-03 ENCOUNTER — Other Ambulatory Visit: Payer: Self-pay | Admitting: Urology

## 2019-12-03 NOTE — Telephone Encounter (Signed)
Spoke with patient. Pre op scheduled for 12/31/2019 at 4:30 pm with Dr.Miller. COVID test scheduled for 01/17/2020 at 3 pm at [facility] location. Patient is aware of the need to quarantine after test until surgery. 1 week post op scheduled for 01/28/2020 at 4 pm with Dr.Miller. 6 week post op scheduled for 03/03/2020 at 3:30 pm with Dr.Miller. Patient is agreeable to all dates and times. Surgery instructions reviewed. Patient verbalizes understanding. Surgery instructions to Joy to give to patient at her pre op appointment.  Routing to provider and will close encounter.

## 2019-12-03 NOTE — Telephone Encounter (Addendum)
Spoke with Pam at [facility]. Surgery scheduled for 01/21/2020 at 0730 at [place] with Dr.Miller going first and Dr.MacDiarmid going second on the case. Spoke with patient. Advised of surgery date and time. Advised will need to schedule pre op, post ops, and COVID test. Patient is unable to at this time. Will call back this afternoon to schedule appointments.

## 2019-12-04 ENCOUNTER — Other Ambulatory Visit: Payer: Self-pay | Admitting: Obstetrics & Gynecology

## 2019-12-11 ENCOUNTER — Telehealth: Payer: Self-pay | Admitting: *Deleted

## 2019-12-11 ENCOUNTER — Telehealth: Payer: Self-pay | Admitting: Obstetrics & Gynecology

## 2019-12-11 NOTE — Telephone Encounter (Signed)
Patient's post op appointment was canceled 01/28/20 due to md being out of the office. No open slots with Dr.Miller. Patient asked if she could be seen 01/24/20, only triage slots available.

## 2019-12-11 NOTE — Telephone Encounter (Signed)
Left message on voicemail regarding cancelled appointment because provider will be out of office. Sending to triage to assist with rescheduling.

## 2019-12-12 NOTE — Telephone Encounter (Signed)
See open telephone encounter dated 12/11/19.   Encounter closed.

## 2019-12-12 NOTE — Telephone Encounter (Signed)
Spoke with patient. Post op appointment moved to 02/11/2020 at 4 pm with Dr.Miller. Patient declines an appointment the week of July 5th due to having a follow up with Dr.MacDiarmid that week. Advised patient if she needs anything or has any concerns following surgery until her post op with Dr.Miller will need to call the office and she can be seen by Dr.Miller or another MD is Dr.Miller is out of the office.  Dr.Miller, okay for 02/11/2020 appt?

## 2019-12-12 NOTE — Telephone Encounter (Signed)
Left message to call Bayli Quesinberry at [phone]. 

## 2019-12-12 NOTE — Telephone Encounter (Signed)
Patient is returning call to Kaitlyn.  

## 2019-12-13 NOTE — Telephone Encounter (Signed)
Please scheduled four week post op visit with me and I will ask Dr. Matilde Sprang to see her for the one week visit.  I will make sure these are scheduled correctly before she goes home from the hospital.

## 2019-12-16 NOTE — Telephone Encounter (Signed)
Spoke with patient. Post op appointment moved to 02/18/2020 at 4:30 pm with Dr.Miller. Patient is agreeable to date and time.  Routing to provider and will close encounter.

## 2019-12-18 ENCOUNTER — Ambulatory Visit: Payer: 59 | Admitting: Internal Medicine

## 2019-12-18 MED FILL — HYDROCHLOROTHIAZIDE 12.5 MG: 12.5 | 90 days supply | Qty: 90 | Fill #0

## 2019-12-18 MED FILL — AMLODIPINE 2.5 MG TABLET: 2.5 | 90 days supply | Qty: 90 | Fill #2

## 2019-12-23 ENCOUNTER — Telehealth: Payer: Self-pay

## 2019-12-23 NOTE — Telephone Encounter (Signed)
Spoke with patient. Advised patient FMLA forms where completed and faxed to [employer] on 12/19/2019. Patient states that she was advised they were not received. Advised will refax to [employer] at number provided on form [phone] at this time. Patient is agreeable. Advised to return call if they need anything additional.  Forms faxed to [employer] at [phone] with confirmation.  Routing to provider and will close encounter.

## 2019-12-23 NOTE — Telephone Encounter (Signed)
Patient is calling to check status of FMLA papers.

## 2019-12-26 ENCOUNTER — Telehealth: Payer: Self-pay | Admitting: Internal Medicine

## 2019-12-26 ENCOUNTER — Telehealth: Payer: 59 | Admitting: Internal Medicine

## 2019-12-26 NOTE — Telephone Encounter (Signed)
Patient last had labs in 08/2019. Would you like for her to have labs before her appointment 12/31/19?   If so please place orders and the patient will go to the [place] lab to have these done.

## 2019-12-26 NOTE — Telephone Encounter (Signed)
No labs for now will order after visit and for after 02/20/20 more comp labs due then    [place]

## 2019-12-27 ENCOUNTER — Other Ambulatory Visit: Payer: Self-pay

## 2019-12-27 NOTE — Progress Notes (Signed)
GYNECOLOGY  VISIT  CC:   Pre op  HPI: 59 y.o. [id number] Married [place] female here for pre-op visit.  Combined TLH/BSO with vaginal vault suspension, cystocele repair with Dr. Matilde Sprang planned.  Pt has been counseled about alteratives including pelvic PT and pessary use and she desires definitive treatment.   Procedure discussed with patient.  Hospital stay, recovery and pain management all discussed.  Risks discussed including but not limited to bleeding, 1% risk of receiving a  transfusion, infection, 3-4% risk of bowel/bladder/ureteral/vascular injury discussed as well as possible need for additional surgery if injury does occur discussed.  DVT/PE and rare risk of death discussed.  My actual complications with prior surgeries discussed.  Vaginal cuff dehiscence discussed.  Hernia formation discussed.  Positioning and incision locations discussed.  Patient aware if pathology abnormal she may need additional treatment.  All questions answered.    Last pap:  10/04/2018 neg with neg HR HPV  GYNECOLOGIC HISTORY: Patient's last menstrual period was 08/01/2009 (approximate). Contraception: post menopausal Menopausal hormone therapy: none  Patient Active Problem List   Diagnosis Date Noted   Prediabetes 12/31/2019   Incomplete uterine prolapse 11/29/2019   Female cystocele 11/29/2019   Chronic pain of both knees 06/20/2019   Neck stiffness 12/06/2017   Constipation 08/16/2017   Annual physical exam 11/18/2015   Chest pain 09/30/2015   Hyperlipidemia 10/14/2014   Anemia    Hypertension     Past Medical History:  Diagnosis Date   Anemia    Hyperlipidemia    Hypertension    Prediabetes     Past Surgical History:  Procedure Laterality Date   TUBAL LIGATION      MEDS:   Current Outpatient Medications on File Prior to Visit  Medication Sig Dispense Refill   aspirin 81 MG chewable tablet Chew 81 mg by mouth daily.     atorvastatin (LIPITOR) 20 MG tablet Take 1  tablet (20 mg total) by mouth daily at 6 PM. 90 tablet 3   Multiple Vitamin (MULTIVITAMIN WITH MINERALS) TABS tablet Take 1 tablet by mouth daily.     Omega-3 Fatty Acids (FISH OIL) 1000 MG CAPS Take one by mouth daily     VITAMIN D PO Take 1,000 Int'l Units by mouth daily.     No current facility-administered medications on file prior to visit.    ALLERGIES: Ace inhibitors  Family History  Problem Relation Age of Onset   Hyperlipidemia Mother    Hypertension Mother    Aneurysm Mother    Hyperlipidemia Father    Breast cancer Neg Hx     SH:  Married, non smoker  Review of Systems  Constitutional: Negative.   HENT: Negative.   Eyes: Negative.   Respiratory: Negative.   Cardiovascular: Negative.   Gastrointestinal: Negative.   Endocrine: Negative.   Genitourinary: Negative.   Musculoskeletal: Negative.   Skin: Negative.   Allergic/Immunologic: Negative.   Neurological: Negative.   Hematological: Negative.   Psychiatric/Behavioral: Negative.     PHYSICAL EXAMINATION:    BP 112/76    Pulse 76    Temp (!) 97.3 F (36.3 C) (Skin)    Resp 16    Wt 147 lb (66.7 kg)    LMP 08/01/2009 (Approximate)    BMI 26.04 kg/m     General appearance: alert, cooperative and appears stated age CV:  Regular rate and rhythm Lungs:  clear to auscultation, no wheezes, rales or rhonchi, symmetric air entry Abdomen: soft, non-tender; bowel sounds normal; no masses,  no organomegaly Lymph:  no inguinal LAD noted  Pelvic: External genitalia:  no lesions              Urethra:  normal appearing urethra with no masses, tenderness or lesions              Bartholins and Skenes: normal                 Vagina: normal appearing vagina with normal color and discharge, no lesions              Cervix: no lesions              Bimanual Exam:  Uterus:  normal size, contour, position, consistency, mobility, non-tender              Adnexa: no mass, fullness, tenderness  Chaperone, Terence Lux,  CMA, was present for exam.  Assessment: Cystocele with incomplete uterine prolapse, TLH/BSO, cystoscopy with prolapse repair planned with Dr. Matilde Sprang Hypertension Elevated lipids  Plan: Pre op and post op planning discussed.  Do's and don'ts discussed.  Hospital stay, pain management, time out of work all reviewed.  Questions answered.   Pt knows to stop ASA at least 5 days prior to procedure

## 2019-12-27 NOTE — Telephone Encounter (Signed)
Left message to return call and that I would also send a mychart message.

## 2019-12-31 ENCOUNTER — Encounter: Payer: Self-pay | Admitting: Internal Medicine

## 2019-12-31 ENCOUNTER — Ambulatory Visit (INDEPENDENT_AMBULATORY_CARE_PROVIDER_SITE_OTHER): Payer: 59 | Admitting: Obstetrics & Gynecology

## 2019-12-31 ENCOUNTER — Other Ambulatory Visit: Payer: Self-pay

## 2019-12-31 ENCOUNTER — Other Ambulatory Visit: Payer: Self-pay | Admitting: Internal Medicine

## 2019-12-31 ENCOUNTER — Encounter: Payer: Self-pay | Admitting: Obstetrics & Gynecology

## 2019-12-31 ENCOUNTER — Telehealth (INDEPENDENT_AMBULATORY_CARE_PROVIDER_SITE_OTHER): Payer: 59 | Admitting: Internal Medicine

## 2019-12-31 VITALS — BP 132/84 | Ht 63.0 in | Wt 145.0 lb

## 2019-12-31 VITALS — BP 112/76 | HR 76 | Temp 97.3°F | Resp 16 | Wt 147.0 lb

## 2019-12-31 DIAGNOSIS — M25561 Pain in right knee: Secondary | ICD-10-CM | POA: Diagnosis not present

## 2019-12-31 DIAGNOSIS — R7303 Prediabetes: Secondary | ICD-10-CM | POA: Diagnosis not present

## 2019-12-31 DIAGNOSIS — I10 Essential (primary) hypertension: Secondary | ICD-10-CM

## 2019-12-31 DIAGNOSIS — E785 Hyperlipidemia, unspecified: Secondary | ICD-10-CM | POA: Diagnosis not present

## 2019-12-31 DIAGNOSIS — M25562 Pain in left knee: Secondary | ICD-10-CM | POA: Diagnosis not present

## 2019-12-31 DIAGNOSIS — Z1329 Encounter for screening for other suspected endocrine disorder: Secondary | ICD-10-CM | POA: Diagnosis not present

## 2019-12-31 DIAGNOSIS — N8111 Cystocele, midline: Secondary | ICD-10-CM

## 2019-12-31 DIAGNOSIS — N812 Incomplete uterovaginal prolapse: Secondary | ICD-10-CM

## 2019-12-31 DIAGNOSIS — N8110 Cystocele, unspecified: Secondary | ICD-10-CM | POA: Diagnosis not present

## 2019-12-31 DIAGNOSIS — Z1389 Encounter for screening for other disorder: Secondary | ICD-10-CM | POA: Diagnosis not present

## 2019-12-31 DIAGNOSIS — G8929 Other chronic pain: Secondary | ICD-10-CM | POA: Diagnosis not present

## 2019-12-31 MED ORDER — HYDROCHLOROTHIAZIDE 12.5 MG PO TABS: 12.5000 mg | ORAL_TABLET | Freq: Every day | ORAL | 3 refills | Status: DC

## 2019-12-31 MED ORDER — AMLODIPINE BESYLATE 5 MG PO TABS: ORAL_TABLET | ORAL | 3 refills | Status: DC

## 2019-12-31 MED FILL — AMLODIPINE BESYLATE 5 MG TA: 5 | 90 days supply | Qty: 90 | Fill #0

## 2019-12-31 NOTE — Patient Instructions (Signed)
Stool softener--Colace (docusate sodium is the generic).  100mg  capsule.  You can take 4 a day.  Normally I start twice daily dosing after surgery.

## 2019-12-31 NOTE — Patient Instructions (Addendum)
X 2 doses 2nd dose 2-6 months after 1st   Zoster Vaccine, Recombinant injection What is this medicine? ZOSTER VACCINE (ZOS ter vak SEEN) is used to prevent shingles in adults 60 years old and over. This vaccine is not used to treat shingles or nerve pain from shingles. This medicine may be used for other purposes; ask your health care provider or pharmacist if you have questions. COMMON BRAND NAME(S): [facility] What should I tell my health care provider before I take this medicine? They need to know if you have any of these conditions:  blood disorders or disease  cancer like leukemia or lymphoma  immune system problems or therapy  an unusual or allergic reaction to vaccines, other medications, foods, dyes, or preservatives  pregnant or trying to get pregnant  breast-feeding How should I use this medicine? This vaccine is for injection in a muscle. It is given by a health care professional. Talk to your pediatrician regarding the use of this medicine in children. This medicine is not approved for use in children. Overdosage: If you think you have taken too much of this medicine contact a poison control center or emergency room at once. NOTE: This medicine is only for you. Do not share this medicine with others. What if I miss a dose? Keep appointments for follow-up (booster) doses as directed. It is important not to miss your dose. Call your doctor or health care professional if you are unable to keep an appointment. What may interact with this medicine?  medicines that suppress your immune system  medicines to treat cancer  steroid medicines like prednisone or cortisone This list may not describe all possible interactions. Give your health care provider a list of all the medicines, herbs, non-prescription drugs, or dietary supplements you use. Also tell them if you smoke, drink alcohol, or use illegal drugs. Some items may interact with your medicine. What should I watch for while  using this medicine? Visit your doctor for regular check ups. This vaccine, like all vaccines, may not fully protect everyone. What side effects may I notice from receiving this medicine? Side effects that you should report to your doctor or health care professional as soon as possible:  allergic reactions like skin rash, itching or hives, swelling of the face, lips, or tongue  breathing problems Side effects that usually do not require medical attention (report these to your doctor or health care professional if they continue or are bothersome):  chills  headache  fever  nausea, vomiting  redness, warmth, pain, swelling or itching at site where injected  tiredness This list may not describe all possible side effects. Call your doctor for medical advice about side effects. You may report side effects to FDA at 1-800-FDA-1088. Where should I keep my medicine? This vaccine is only given in a clinic, pharmacy, doctor's office, or other health care setting and will not be stored at home. NOTE: This sheet is a summary. It may not cover all possible information. If you have questions about this medicine, talk to your doctor, pharmacist, or health care provider.  2020 Elsevier/Gold Standard (2017-02-27 13:20:30)

## 2019-12-31 NOTE — Progress Notes (Signed)
telephone Note  I connected with Joan Morgan   on 12/31/19 at  1:40 PM EDT by telephone and verified that I am speaking with the correct person using two identifiers.  Location patient: home Location provider:work or home office Persons participating in the virtual visit: patient, provider  I discussed the limitations of evaluation and management by telemedicine and the availability of in person appointments. The patient expressed understanding and agreed to proceed.   HPI: 1. HTN BP sl elevated today on norvasc 2.5 mg qd, hctz 12.5 mg qam  2. B/l knee pain improved with joint Juice saw Dr. Alvan Dame no steroid injection or no need for surgery  3. Prediabetes  4. HLD lipitor 20 mg qhs causing joint pain will try 3x per week    ROS: See pertinent positives and negatives per HPI.  Past Medical History:  Diagnosis Date  . Anemia   . Hyperlipidemia   . Hypertension   . Prediabetes     Past Surgical History:  Procedure Laterality Date  . TUBAL LIGATION      Family History  Problem Relation Age of Onset  . Hyperlipidemia Mother   . Hypertension Mother   . Aneurysm Mother   . Hyperlipidemia Father   . Breast cancer Neg Hx     SOCIAL HX: married with kids   Current Outpatient Medications:  .  amLODipine (NORVASC) 5 MG tablet, TAKE 1 TABLET (5 MG TOTAL) BY MOUTH DAILY., Disp: 90 tablet, Rfl: 3 .  aspirin 81 MG chewable tablet, Chew 81 mg by mouth daily., Disp: , Rfl:  .  atorvastatin (LIPITOR) 20 MG tablet, Take 1 tablet (20 mg total) by mouth daily at 6 PM., Disp: 90 tablet, Rfl: 3 .  hydrochlorothiazide (HYDRODIURIL) 12.5 MG tablet, Take 1 tablet (12.5 mg total) by mouth daily. In am, Disp: 90 tablet, Rfl: 3 .  Multiple Vitamin (MULTIVITAMIN WITH MINERALS) TABS tablet, Take 1 tablet by mouth daily., Disp: , Rfl:  .  Omega-3 Fatty Acids (FISH OIL) 1000 MG CAPS, Take one by mouth daily, Disp: , Rfl:  .  VITAMIN D PO, Take 1,000 Int'l Units by mouth daily., Disp: , Rfl:    EXAM:  VITALS per patient if applicable:  GENERAL: alert, oriented, appears well and in no acute distress  PSYCH/NEURO: pleasant and cooperative, no obvious depression or anxiety, speech and thought processing grossly intact  ASSESSMENT AND PLAN:  Discussed the following assessment and plan:  Essential hypertension - Plan: hydrochlorothiazide (HYDRODIURIL) 12.5 MG tablet, amLODipine (NORVASC) 5 MG tablet increase from 2.5 mg qd  Goal <130/<80  Hyperlipidemia, unspecified hyperlipidemia type Take lipitor 20 mg qhs 3x per week instead of nightly   Prediabetes  B/l knee pain resolved Seen Dr. Alvan Dame in the past   Female cystocele Pending 01/21/20 hysterectomy and repair   HM-CPE 06/18/20 Had flu shot10/21/20 Tdap utd 12/06/17 Consider and disc shingrix will think about it covid [employer] 2/2   Never smoker  Saw dermatology last year for toenail fungus that never cleared  No issues as of 12/31/19 derm   Breast exam today  Normal mammmo Neg 02/06/2019, pt will [facility]05/[facility]  pap 10/04/18 neg neg HPV mary miller c/o prolapse bladder rec call and sch appt with ob/gyn upcoming 09/2019 may call sooner  Hysterectomy 01/21/20 with cystocele repair  Colonoscopy need copy [facility] done 5-6 years ago FH noncancerous polpys in dad they rec 5 year repeat pt wants to wait until 2023 -got copy  had 08/18/11 normal hemorrhoids repeat in 5 years No FH colon cancer ok to wait 10 years 08/17/2021   Increase exercise doing 4 days a week increase more, also rec change white rice to multigrain or brown rec healthy diet and exercise   dexa age 81   -we discussed possible serious and likely etiologies, options for evaluation and workup, limitations of telemedicine visit vs in person visit, treatment, treatment risks and precautions. Pt prefers to treat via telemedicine empirically rather then risking or undertaking an in person visit at this moment. Patient agrees to seek  prompt in person care if worsening, new symptoms arise, or if is not improving with treatment.   I discussed the assessment and treatment plan with the patient. The patient was provided an opportunity to ask questions and all were answered. The patient agreed with the plan and demonstrated an understanding of the instructions.   The patient was advised to call back or seek an in-person evaluation if the symptoms worsen or if the condition fails to improve as anticipated.  Time spent 20 minutes  Delorise Jackson, MD

## 2020-01-09 NOTE — H&P (Signed)
I was consulted to assess the patient's 2 year history of prolapse symptoms. She feels vaginal bulging. Sometimes she reduce it. She has not had a hysterectomy   At baseline she is continent. She voids every 2-3 hours and drinks lot of fluids. She gets up twice a night reports a good flow. Sometimes the flow sprays   I read her gynecology referral notes and she was hoping to have occult CIS and she does not want a hysterectomy. I think she was also hoping to have outpatient surgery   On pelvic examination she had a small grade 3 cystocele and with multiple Valsalvas she would get rotational descent and would reached the introitus with a moderate midline defect. The cervix to sent from 8 or 9 cm to approximately 5 cm. She has some deficiency of the posterior fourchette making her looked like she had a grade 1 rectocele but with multiple Valsalva she really does not have anything to repair. She would need to know that this would be visually present long-term.   Urinalysis negative. Bladder scan 61 mL. Reviewed medical record   Patient has prolapse symptoms and minimal voiding dysfunction with mild nocturia. A picture was drawn. If she ever had surgery she would be best managed in my opinion by transvaginal hysterectomy with cystocele repair vault suspension and graft. With fixation of the cuff to the ureteral sacral ligaments she might not need a vault suspension but probably does. No posterior repair would be performed. I believe leaving the cervix and uterus intact will lead to higher failures and based on rotational descent and findings needs support at the apex and not just anteriorly. Some surgeons will try to support the apex with the uterus intact and a 2nd opinion was also offered   She would like to proceed with urodynamics and I think she is comfortable doing a hysterectomy and surgery as planned. I would not be surprised that the cystocele is actually smaller once the uterus is removed.    Today  Frequency stable. Prolapse symptoms stable  The patient did not void and was catheterized for 150 mL. First residual was 61 mL day 1. Maximum bladder capacity was 150 mL. Bladder was hypo sensitive. Bladder was stable. She leaked a moderate amount at 600 mL with Valsalva leak point pressure of 56 cm of water. She did have some trouble initiating urination in the lab setting. She then voided 100 mL with maximum flow 35 mL/second. Maximum voiding pressure 12 cm water. Residual 50 mL. EMG activity increased minimally during the voiding phase. Fluoroscopically bladder neck 1-2 cm descended. Prolapse noted. No stress incontinence until 400 mL was in her bladder. The details of the urodynamics or sign and dictated   Picture drawn. I went over transvaginal vault suspension cystocele repair and graft versus watchful waiting versus pessary. I talked about a sling but based upon her large capacity bladder, mildly abnormal voiding phase, and the absence of incontinence no sling I think is a good option. Delayed sling and sequelae discussed in detail. Mesh issue discussed   Patient will talk to Dr. Sabra Heck about hysterectomy. She might be interested in a pessary and she is not sexually active. She likely want to have surgery she does in the summer when her family stone. I will hear from Dr. Sabra Heck she wants to proceed. I will send this note to Dr. Sabra Heck. She does not want to have a sling and I think this is a good choice for her  ALLERGIES: ACE Inhibitors    MEDICATIONS: Aspirin  Hydrochlorothiazide 12.5 mg capsule  Amlodipine Besilate  Atorvastatin Calcium 20 mg tablet  Fish Oil  Multivitamin  Vitamin D3 25 mcg (1,000 unit) tablet     GU PSH: Complex cystometrogram, w/ void pressure and urethral pressure profile studies, any technique - 10/15/2019 Complex Uroflow - 10/15/2019 Emg surf Electrd - 10/15/2019 Inject For cystogram - 10/15/2019 Intrabd voidng Press - 10/15/2019     NON-GU PSH:  Bilateral Tubal Ligation     GU PMH: Urinary Frequency - 10/15/2019 Cystocele, midline - 09/10/2019 Nocturia - 09/10/2019    NON-GU PMH: None   FAMILY HISTORY: None   SOCIAL HISTORY: None   REVIEW OF SYSTEMS:    GU Review Female:   Patient denies hard to postpone urination, being pregnant, leakage of urine, stream starts and stops, frequent urination, have to strain to urinate, burning /pain with urination, get up at night to urinate, and trouble starting your stream.  Gastrointestinal (Upper):   Patient denies nausea, vomiting, and indigestion/ heartburn.  Gastrointestinal (Lower):   Patient denies diarrhea and constipation.  Constitutional:   Patient denies fever, night sweats, weight loss, and fatigue.  Skin:   Patient denies skin rash/ lesion and itching.  Eyes:   Patient denies blurred vision and double vision.  Ears/ Nose/ Throat:   Patient denies sore throat and sinus problems.  Hematologic/Lymphatic:   Patient denies swollen glands and easy bruising.  Cardiovascular:   Patient denies leg swelling and chest pains.  Respiratory:   Patient denies cough and shortness of breath.  Endocrine:   Patient denies excessive thirst.  Musculoskeletal:   Patient denies back pain and joint pain.  Neurological:   Patient denies headaches and dizziness.  Psychologic:   Patient denies depression and anxiety.   VITAL SIGNS: None   PAST DATA REVIEWED:  Source Of History:  Patient   PROCEDURES:          Urinalysis Dipstick Dipstick Cont'd  Color: Yellow Bilirubin: Neg mg/dL  Appearance: Clear Ketones: Neg mg/dL  Specific Gravity: <=1.005 Blood: Neg ery/uL  pH: 5.5 Protein: Neg mg/dL  Glucose: Neg mg/dL Urobilinogen: 0.2 mg/dL    Nitrites: Neg    Leukocyte Esterase: Neg leu/uL    ASSESSMENT:      ICD-10 Details  1 GU:   Cystocele, midline - N81.11   2   Urinary Frequency - R35.0               Notes:   I drew her a picture and we talked about prolapse surgery in detail. Pros, cons,  general surgical and anesthetic risks, and other options including behavioral therapy, pessaries, and watchful waiting were discussed. She understands that prolapse repairs are successful in 80-85% of cases for prolapse symptoms and can recur anteriorly, posteriorly, and/or apically. She understands that in most cases I use a graft and general risks were discussed. Surgical risks were described but not limited to the discussion of injury to neighboring structures including the bowel (with possible life-threatening sepsis and colostomy), bladder, urethra, vagina (all resulting in further surgery), and ureter (resulting in re-implantation). We talked about injury to nerves/soft tissue leading to debilitating and intractable pelvic, abdominal, and lower extremity pain syndromes and neuropathies. The risks of buttock pain, intractable dyspareunia, and vaginal narrowing and shortening with sequelae were discussed. Bleeding risks, transfusion rates, and infection were discussed. The risk of persistent, de novo, or worsening bladder and/or bowel incontinence/dysfunction was discussed. The need for CIC was described  as well the usual post-operative course. The patient understands that she might not reach her treatment goal and that she might be worse following surgery.   We talked about a sling in detail. Pros, cons, general surgical and anesthetic risks, and other options including behavioral therapy and watchful waiting were discussed. She understands that slings are generally successful in 90% of cases for stress incontinence, 50% for urge incontinence, and that in a small % of cases the incontinence can worsen. The risk of persistent, de novo, or worsening incontinence/dysfunction was discussed. Risks were described but not limited to the discussion of injury to neighboring structures including the bowel (with possible life-threatening sepsis and colostomy), bladder, urethra, vagina (all resulting in further surgery),  and ureter (resulting in re-implantation). We also talked about the risk of retention requiring urethrolysis, extrusion requiring revision, and erosion resulting in further surgery. Bleeding risks and transfusion rates and the risk of infection were discussed. The risk of pelvic and abdominal pain syndromes, dyspareunia, and neuropathies were discussed. The need for CIC was described as well the usual post-operative course. The patient understands that she might not reach her treatment goal and that she might be worse following surgery.   After a thorough review of the management options for the patient's condition the patient  elected to proceed with surgical therapy as noted above. We have discussed the potential benefits and risks of the procedure, side effects of the proposed treatment, the likelihood of the patient achieving the goals of the procedure, and any potential problems that might occur during the procedure or recuperation. Informed consent has been obtained.

## 2020-01-10 NOTE — Patient Instructions (Addendum)
DUE TO COVID-19 ONLY ONE VISITOR IS ALLOWED TO COME WITH YOU AND STAY IN THE WAITING ROOM ONLY DURING PRE OP AND PROCEDURE DAY OF SURGERY. THE 1 VISITOR MAY VISIT WITH YOU AFTER SURGERY IN YOUR PRIVATE ROOM DURING VISITING HOURS ONLY!  YOU NEED TO HAVE A COVID 19 TEST ON: 01/17/20 @ 3:00 pm , THIS TEST MUST BE DONE BEFORE SURGERY, COME  [place] , [id number].  ([place]) ONCE YOUR COVID TEST IS COMPLETED, PLEASE BEGIN THE QUARANTINE INSTRUCTIONS AS OUTLINED IN YOUR HANDOUT.                Joan Morgan    Your procedure is scheduled on: 01/21/20   Report to [facility] Main  Entrance   Report to short stay at: 5:30 AM     Call this number if you have problems the morning of surgery [phone]    Remember: Do not eat food or drink liquids :After Midnight.   BRUSH YOUR TEETH MORNING OF SURGERY AND RINSE YOUR MOUTH OUT, NO CHEWING GUM CANDY OR MINTS.     Take these medicines the morning of surgery with A SIP OF WATER: amlodipine.                                You may not have any metal on your body including hair pins and              piercings  Do not wear jewelry, make-up, lotions, powders or perfumes, deodorant             Do not wear nail polish on your fingernails.  Do not shave  48 hours prior to surgery.                Do not bring valuables to the hospital. [place].  Contacts, dentures or bridgework may not be worn into surgery.  Leave suitcase in the car. After surgery it may be brought to your room.     Patients discharged the day of surgery will not be allowed to drive home. IF YOU ARE HAVING SURGERY AND GOING HOME THE SAME DAY, YOU MUST HAVE AN ADULT TO DRIVE YOU HOME AND BE WITH YOU FOR 24 HOURS. YOU MAY GO HOME BY TAXI OR UBER OR ORTHERWISE, BUT AN ADULT MUST ACCOMPANY YOU HOME AND STAY WITH YOU FOR 24 HOURS.  Name and phone number of your driver:  Special  Instructions: N/A              Please read over the following fact sheets you were given: _____________________________________________________________________  [facility] - Preparing for Surgery Before surgery, you can play an important role.  Because skin is not sterile, your skin needs to be as free of germs as possible.  You can reduce the number of germs on your skin by washing with CHG (chlorahexidine gluconate) soap before surgery.  CHG is an antiseptic cleaner which kills germs and bonds with the skin to continue killing germs even after washing. Please DO NOT use if you have an allergy to CHG or antibacterial soaps.  If your skin becomes reddened/irritated stop using the CHG and inform your nurse when you arrive at Short Stay. Do not shave (including legs and underarms) for at least 48 hours prior to  the first CHG shower.  You may shave your face/neck. Please follow these instructions carefully:  1.  Shower with CHG Soap the night before surgery and the  morning of Surgery.  2.  If you choose to wash your hair, wash your hair first as usual with your  normal  shampoo.  3.  After you shampoo, rinse your hair and body thoroughly to remove the  shampoo.                           4.  Use CHG as you would any other liquid soap.  You can apply chg directly  to the skin and wash                       Gently with a scrungie or clean washcloth.  5.  Apply the CHG Soap to your body ONLY FROM THE NECK DOWN.   Do not use on face/ open                           Wound or open sores. Avoid contact with eyes, ears mouth and genitals (private parts).                       Wash face,  Genitals (private parts) with your normal soap.             6.  Wash thoroughly, paying special attention to the area where your surgery  will be performed.  7.  Thoroughly rinse your body with warm water from the neck down.  8.  DO NOT shower/wash with your normal soap after using and rinsing off  the CHG Soap.                 9.  Pat yourself dry with a clean towel.            10.  Wear clean pajamas.            11.  Place clean sheets on your bed the night of your first shower and do not  sleep with pets. Day of Surgery : Do not apply any lotions/deodorants the morning of surgery.  Please wear clean clothes to the hospital/surgery center.  FAILURE TO FOLLOW THESE INSTRUCTIONS MAY RESULT IN THE CANCELLATION OF YOUR SURGERY PATIENT SIGNATURE_________________________________  NURSE SIGNATURE__________________________________  ________________________________________________________________________

## 2020-01-13 ENCOUNTER — Encounter (HOSPITAL_COMMUNITY)
Admission: RE | Admit: 2020-01-13 | Discharge: 2020-01-13 | Disposition: A | Discharge status: Home / Self Care | Payer: 59 | Source: Ambulatory Visit | Attending: Family Medicine | Admitting: Family Medicine

## 2020-01-13 ENCOUNTER — Other Ambulatory Visit: Payer: Self-pay

## 2020-01-13 ENCOUNTER — Encounter (HOSPITAL_COMMUNITY): Payer: Self-pay

## 2020-01-13 DIAGNOSIS — Z01818 Encounter for other preprocedural examination: Secondary | ICD-10-CM | POA: Insufficient documentation

## 2020-01-13 HISTORY — DX: Prediabetes: R73.03

## 2020-01-13 NOTE — Progress Notes (Signed)
COVID Vaccine Completed:yes Date COVID Vaccine completed:08/19/19 COVID vaccine manufacturer: *Pfizer    [employer]   PCP - Dr. Olivia Mackie Mclean-Scocuzza. Cardiologist -   Chest x-ray -  EKG -  Stress Test -  ECHO - 2017 Cardiac Cath -   Sleep Study -  CPAP -   Fasting Blood Sugar -  Checks Blood Sugar _____ times a day  Blood Thinner Instructions: Aspirin Instructions: It is on hold. Last Dose:  Anesthesia review:   Patient denies shortness of breath, fever, cough and chest pain at PAT appointment   Patient verbalized understanding of instructions that were given to them at the PAT appointment. Patient was also instructed that they will need to review over the PAT instructions again at home before surgery.

## 2020-01-15 ENCOUNTER — Other Ambulatory Visit: Payer: Self-pay

## 2020-01-15 ENCOUNTER — Encounter (HOSPITAL_COMMUNITY)
Admission: RE | Admit: 2020-01-15 | Discharge: 2020-01-15 | Disposition: A | Discharge status: Home / Self Care | Payer: 59 | Source: Ambulatory Visit | Attending: Urology | Admitting: Urology

## 2020-01-15 DIAGNOSIS — Z01818 Encounter for other preprocedural examination: Secondary | ICD-10-CM | POA: Insufficient documentation

## 2020-01-15 LAB — BASIC METABOLIC PANEL
Anion gap: 8 (ref 5–15)
BUN: 12 mg/dL (ref 6–20)
CO2: 30 mmol/L (ref 22–32)
Calcium: 9.4 mg/dL (ref 8.9–10.3)
Chloride: 105 mmol/L (ref 98–111)
Creatinine, Ser: 0.82 mg/dL (ref 0.44–1.00)
GFR calc Af Amer: >60 mL/min (ref >60)
GFR calc non Af Amer: >60 mL/min (ref >60)
Glucose, Bld: 127 mg/dL — ABNORMAL HIGH (ref 70–99)
Potassium: 3.3 mmol/L — ABNORMAL LOW (ref 3.5–5.1)
Sodium: 143 mmol/L (ref 135–145)

## 2020-01-15 LAB — CBC
HCT: 41.8 % (ref 36.0–46.0)
Hemoglobin: 13.8 g/dL (ref 12.0–15.0)
MCH: 29.6 pg (ref 26.0–34.0)
MCHC: 33 g/dL (ref 30.0–36.0)
MCV: 89.7 fL (ref 80.0–100.0)
Platelets: 324 10*3/uL (ref 150–400)
RBC: 4.66 MIL/uL (ref 3.87–5.11)
RDW: 13 % (ref 11.5–15.5)
WBC: 7.3 10*3/uL (ref 4.0–10.5)
nRBC: 0 % (ref 0.0–0.2)

## 2020-01-15 LAB — ABO/RH: ABO/RH(D): A POS

## 2020-01-15 LAB — PROTIME-INR
INR: 0.9 (ref 0.8–1.2)
Prothrombin Time: 11.5 seconds (ref 11.4–15.2)

## 2020-01-16 LAB — HEMOGLOBIN A1C
Hgb A1c MFr Bld: 6.1 % — ABNORMAL HIGH (ref 4.8–5.6)
Mean Plasma Glucose: 128 mg/dL

## 2020-01-17 ENCOUNTER — Other Ambulatory Visit (HOSPITAL_COMMUNITY)
Admission: RE | Admit: 2020-01-17 | Discharge: 2020-01-17 | Disposition: A | Discharge status: Home / Self Care | Payer: 59 | Source: Ambulatory Visit | Attending: Urology | Admitting: Urology

## 2020-01-17 DIAGNOSIS — Z20822 Contact with and (suspected) exposure to covid-19: Secondary | ICD-10-CM | POA: Diagnosis not present

## 2020-01-17 DIAGNOSIS — Z01812 Encounter for preprocedural laboratory examination: Secondary | ICD-10-CM | POA: Insufficient documentation

## 2020-01-18 LAB — SARS CORONAVIRUS 2 (TAT 6-24 HRS): SARS Coronavirus 2: NEGATIVE

## 2020-01-20 MED ORDER — GENTAMICIN SULFATE 40 MG/ML IJ SOLN
5.0000 mg/kg | INTRAVENOUS | Status: AC
  Administered 2020-01-21: 330 mg via INTRAVENOUS
  Filled 2020-01-20: qty 8.25

## 2020-01-20 NOTE — Anesthesia Preprocedure Evaluation (Addendum)
Anesthesia Evaluation  Patient identified by MRN, date of birth, ID band Patient awake    Reviewed: Allergy & Precautions, NPO status , Patient's Chart, lab work & pertinent test results  Airway Mallampati: II  TM Distance: >3 FB Neck ROM: Full    Dental  (+) Teeth Intact, Dental Advisory Given   Pulmonary neg pulmonary ROS,    Pulmonary exam normal breath sounds clear to auscultation       Cardiovascular hypertension, Pt. on medications Normal cardiovascular exam Rhythm:Regular Rate:Normal     Neuro/Psych negative neurological ROS  negative psych ROS   GI/Hepatic negative GI ROS, Neg liver ROS,   Endo/Other  negative endocrine ROS  Renal/GU negative Renal ROS    CYSTOCELE VAULT PROLAPSE, INCOMPLETE UTERINE PROLAPSE    Musculoskeletal negative musculoskeletal ROS (+)   Abdominal   Peds  Hematology negative hematology ROS (+)   Anesthesia Other Findings   Reproductive/Obstetrics                            Anesthesia Physical Anesthesia Plan  ASA: II  Anesthesia Plan: General   Post-op Pain Management:    Induction: Intravenous  PONV Risk Score and Plan: 4 or greater and Scopolamine patch - Pre-op, Midazolam, Ondansetron, Dexamethasone and Diphenhydramine  Airway Management Planned: Oral ETT  Additional Equipment:   Intra-op Plan:   Post-operative Plan: Extubation in OR  Informed Consent: I have reviewed the patients History and Physical, chart, labs and discussed the procedure including the risks, benefits and alternatives for the proposed anesthesia with the patient or authorized representative who has indicated his/her understanding and acceptance.     Dental advisory given  Plan Discussed with: CRNA  Anesthesia Plan Comments:        Anesthesia Quick Evaluation

## 2020-01-21 ENCOUNTER — Encounter (HOSPITAL_COMMUNITY): Payer: Self-pay | Admitting: Obstetrics & Gynecology

## 2020-01-21 ENCOUNTER — Ambulatory Visit (HOSPITAL_COMMUNITY): Payer: 59 | Admitting: Certified Registered Nurse Anesthetist

## 2020-01-21 ENCOUNTER — Encounter (HOSPITAL_COMMUNITY)
Admission: RE | Disposition: A | Discharge status: Home / Self Care | Payer: Self-pay | Source: Home / Self Care | Attending: Urology

## 2020-01-21 ENCOUNTER — Observation Stay (HOSPITAL_COMMUNITY)
Admission: RE | Admit: 2020-01-21 | Discharge: 2020-01-22 | Disposition: A | Discharge status: Home / Self Care | Payer: 59 | Attending: Urology | Admitting: Urology

## 2020-01-21 ENCOUNTER — Other Ambulatory Visit: Payer: Self-pay

## 2020-01-21 DIAGNOSIS — Z888 Allergy status to other drugs, medicaments and biological substances status: Secondary | ICD-10-CM | POA: Diagnosis not present

## 2020-01-21 DIAGNOSIS — N72 Inflammatory disease of cervix uteri: Secondary | ICD-10-CM | POA: Insufficient documentation

## 2020-01-21 DIAGNOSIS — N888 Other specified noninflammatory disorders of cervix uteri: Secondary | ICD-10-CM | POA: Diagnosis not present

## 2020-01-21 DIAGNOSIS — Z79899 Other long term (current) drug therapy: Secondary | ICD-10-CM | POA: Diagnosis not present

## 2020-01-21 DIAGNOSIS — N812 Incomplete uterovaginal prolapse: Secondary | ICD-10-CM | POA: Diagnosis not present

## 2020-01-21 DIAGNOSIS — E785 Hyperlipidemia, unspecified: Secondary | ICD-10-CM | POA: Diagnosis not present

## 2020-01-21 DIAGNOSIS — R7303 Prediabetes: Secondary | ICD-10-CM | POA: Diagnosis not present

## 2020-01-21 DIAGNOSIS — Z7982 Long term (current) use of aspirin: Secondary | ICD-10-CM | POA: Insufficient documentation

## 2020-01-21 DIAGNOSIS — D282 Benign neoplasm of uterine tubes and ligaments: Secondary | ICD-10-CM | POA: Diagnosis not present

## 2020-01-21 DIAGNOSIS — N8111 Cystocele, midline: Secondary | ICD-10-CM | POA: Diagnosis not present

## 2020-01-21 DIAGNOSIS — I10 Essential (primary) hypertension: Secondary | ICD-10-CM | POA: Diagnosis not present

## 2020-01-21 DIAGNOSIS — D649 Anemia, unspecified: Secondary | ICD-10-CM | POA: Diagnosis not present

## 2020-01-21 DIAGNOSIS — N814 Uterovaginal prolapse, unspecified: Secondary | ICD-10-CM | POA: Diagnosis present

## 2020-01-21 HISTORY — PX: CYSTOCELE REPAIR: SHX163

## 2020-01-21 HISTORY — PX: TOTAL LAPAROSCOPIC HYSTERECTOMY WITH BILATERAL SALPINGO OOPHORECTOMY: SHX6845

## 2020-01-21 HISTORY — PX: VAGINAL PROLAPSE REPAIR: SHX830

## 2020-01-21 LAB — TYPE AND SCREEN
ABO/RH(D): A POS
Antibody Screen: NEGATIVE

## 2020-01-21 LAB — HEMOGLOBIN AND HEMATOCRIT, BLOOD
HCT: 40.8 % (ref 36.0–46.0)
Hemoglobin: 13.5 g/dL (ref 12.0–15.0)

## 2020-01-21 SURGERY — SUSPENSION, VAGINAL VAULT
Anesthesia: General | Site: Vagina

## 2020-01-21 MED ORDER — MIDAZOLAM HCL 2 MG/2ML IJ SOLN
INTRAMUSCULAR | Status: AC
  Filled 2020-01-21: qty 2

## 2020-01-21 MED ORDER — SODIUM CHLORIDE 0.9 % IV SOLN
INTRAVENOUS | Status: DC | PRN
  Administered 2020-01-21: 500 mL

## 2020-01-21 MED ORDER — ORAL CARE MOUTH RINSE: 15.0000 mL | Freq: Once | OROMUCOSAL | Status: AC

## 2020-01-21 MED ORDER — EPHEDRINE SULFATE-NACL 50-0.9 MG/10ML-% IV SOSY
PREFILLED_SYRINGE | INTRAVENOUS | Status: DC | PRN
  Administered 2020-01-21 (×2): 5 mg via INTRAVENOUS

## 2020-01-21 MED ORDER — SODIUM CHLORIDE 0.9 % IV SOLN
2.0000 g | INTRAVENOUS | Status: DC
  Filled 2020-01-21: qty 2

## 2020-01-21 MED ORDER — HYDROMORPHONE HCL 1 MG/ML IJ SOLN
0.5000 mg | INTRAMUSCULAR | Status: DC | PRN
  Administered 2020-01-21 (×2): 1 mg via INTRAVENOUS
  Filled 2020-01-21 (×2): qty 1

## 2020-01-21 MED ORDER — ROCURONIUM BROMIDE 10 MG/ML (PF) SYRINGE
PREFILLED_SYRINGE | INTRAVENOUS | Status: DC | PRN
  Administered 2020-01-21: 60 mg via INTRAVENOUS

## 2020-01-21 MED ORDER — BUPIVACAINE HCL 0.25 % IJ SOLN
INTRAMUSCULAR | Status: AC
  Filled 2020-01-21: qty 1

## 2020-01-21 MED ORDER — SUGAMMADEX SODIUM 200 MG/2ML IV SOLN
INTRAVENOUS | Status: DC | PRN
  Administered 2020-01-21: 200 mg via INTRAVENOUS

## 2020-01-21 MED ORDER — ONDANSETRON HCL 4 MG/2ML IJ SOLN
4.0000 mg | INTRAMUSCULAR | Status: DC | PRN
  Administered 2020-01-21: 4 mg via INTRAVENOUS
  Filled 2020-01-21: qty 2

## 2020-01-21 MED ORDER — SODIUM CHLORIDE 0.9 % IR SOLN
Status: DC | PRN
  Administered 2020-01-21: 1000 mL

## 2020-01-21 MED ORDER — SODIUM CHLORIDE 0.9 % IV SOLN
INTRAVENOUS | Status: AC
  Filled 2020-01-21 (×2): qty 500000

## 2020-01-21 MED ORDER — PROPOFOL 10 MG/ML IV BOLUS
INTRAVENOUS | Status: AC
  Filled 2020-01-21: qty 20

## 2020-01-21 MED ORDER — FENTANYL CITRATE (PF) 100 MCG/2ML IJ SOLN
25.0000 ug | INTRAMUSCULAR | Status: DC | PRN
  Administered 2020-01-21: 50 ug via INTRAVENOUS

## 2020-01-21 MED ORDER — SODIUM CHLORIDE (PF) 0.9 % IJ SOLN
INTRAMUSCULAR | Status: AC
  Filled 2020-01-21: qty 50

## 2020-01-21 MED ORDER — MIDAZOLAM HCL 5 MG/5ML IJ SOLN
INTRAMUSCULAR | Status: DC | PRN
  Administered 2020-01-21: 2 mg via INTRAVENOUS

## 2020-01-21 MED ORDER — DIPHENHYDRAMINE HCL 50 MG/ML IJ SOLN: 12.5000 mg | Freq: Four times a day (QID) | INTRAMUSCULAR | Status: DC | PRN

## 2020-01-21 MED ORDER — PHENAZOPYRIDINE HCL 200 MG PO TABS
200.0000 mg | ORAL_TABLET | Freq: Once | ORAL | Status: AC
  Administered 2020-01-21: 200 mg via ORAL
  Filled 2020-01-21: qty 1

## 2020-01-21 MED ORDER — EPHEDRINE 5 MG/ML INJ
INTRAVENOUS | Status: AC
  Filled 2020-01-21: qty 10

## 2020-01-21 MED ORDER — DEXAMETHASONE SODIUM PHOSPHATE 4 MG/ML IJ SOLN
INTRAMUSCULAR | Status: DC | PRN
  Administered 2020-01-21: 5 mg via INTRAVENOUS

## 2020-01-21 MED ORDER — LIDOCAINE 2% (20 MG/ML) 5 ML SYRINGE
INTRAMUSCULAR | Status: AC
  Filled 2020-01-21: qty 10

## 2020-01-21 MED ORDER — DEXAMETHASONE SODIUM PHOSPHATE 10 MG/ML IJ SOLN
INTRAMUSCULAR | Status: AC
  Filled 2020-01-21: qty 1

## 2020-01-21 MED ORDER — CLINDAMYCIN PHOSPHATE 2 % VA CREA
TOPICAL_CREAM | VAGINAL | Status: DC | PRN
  Administered 2020-01-21: 1 via VAGINAL

## 2020-01-21 MED ORDER — ACETAMINOPHEN 500 MG PO TABS
1000.0000 mg | ORAL_TABLET | ORAL | Status: AC
  Administered 2020-01-21: 1000 mg via ORAL
  Filled 2020-01-21: qty 2

## 2020-01-21 MED ORDER — ATORVASTATIN CALCIUM 20 MG PO TABS
20.0000 mg | ORAL_TABLET | Freq: Every day | ORAL | Status: DC
  Administered 2020-01-21: 20 mg via ORAL
  Filled 2020-01-21: qty 1

## 2020-01-21 MED ORDER — ONDANSETRON HCL 4 MG/2ML IJ SOLN
INTRAMUSCULAR | Status: AC
  Filled 2020-01-21: qty 2

## 2020-01-21 MED ORDER — LIDOCAINE-EPINEPHRINE 1 %-1:100000 IJ SOLN
INTRAMUSCULAR | Status: AC
  Filled 2020-01-21: qty 1

## 2020-01-21 MED ORDER — ENOXAPARIN SODIUM 40 MG/0.4ML ~~LOC~~ SOLN
40.0000 mg | SUBCUTANEOUS | Status: AC
  Administered 2020-01-21: 40 mg via SUBCUTANEOUS
  Filled 2020-01-21: qty 0.4

## 2020-01-21 MED ORDER — KETAMINE HCL 10 MG/ML IJ SOLN
INTRAMUSCULAR | Status: DC | PRN
  Administered 2020-01-21: 30 mg via INTRAVENOUS
  Administered 2020-01-21 (×2): 20 mg via INTRAVENOUS

## 2020-01-21 MED ORDER — LIDOCAINE-EPINEPHRINE 2 %-1:100000 IJ SOLN
INTRAMUSCULAR | Status: DC | PRN
  Administered 2020-01-21: 30 mL

## 2020-01-21 MED ORDER — CHLORHEXIDINE GLUCONATE CLOTH 2 % EX PADS
6.0000 | MEDICATED_PAD | Freq: Every day | CUTANEOUS | Status: DC
  Administered 2020-01-21: 6 via TOPICAL

## 2020-01-21 MED ORDER — ACETAMINOPHEN 325 MG PO TABS
650.0000 mg | ORAL_TABLET | ORAL | Status: DC | PRN
  Administered 2020-01-21 – 2020-01-22 (×3): 650 mg via ORAL
  Filled 2020-01-21 (×3): qty 2

## 2020-01-21 MED ORDER — PROMETHAZINE HCL 25 MG/ML IJ SOLN: 6.2500 mg | INTRAMUSCULAR | Status: DC | PRN

## 2020-01-21 MED ORDER — HYDROCODONE-ACETAMINOPHEN 5-325 MG PO TABS: 1.0000 | ORAL_TABLET | Freq: Four times a day (QID) | ORAL | 0 refills | Status: DC | PRN

## 2020-01-21 MED ORDER — LIDOCAINE 20MG/ML (2%) 15 ML SYRINGE OPTIME
INTRAMUSCULAR | Status: DC | PRN
Start: 2020-01-21 — End: 2020-01-21
  Administered 2020-01-21: 1.5 mg/kg/h via INTRAVENOUS

## 2020-01-21 MED ORDER — CLINDAMYCIN PHOSPHATE 2 % VA CREA
TOPICAL_CREAM | VAGINAL | Status: AC
  Filled 2020-01-21: qty 80

## 2020-01-21 MED ORDER — HYDROCHLOROTHIAZIDE 25 MG PO TABS
12.5000 mg | ORAL_TABLET | Freq: Every day | ORAL | Status: DC
  Administered 2020-01-21 – 2020-01-22 (×2): 12.5 mg via ORAL
  Filled 2020-01-21 (×2): qty 1

## 2020-01-21 MED ORDER — DIPHENHYDRAMINE HCL 12.5 MG/5ML PO ELIX: 12.5000 mg | ORAL_SOLUTION | Freq: Four times a day (QID) | ORAL | Status: DC | PRN

## 2020-01-21 MED ORDER — SCOPOLAMINE 1 MG/3DAYS TD PT72
1.0000 | MEDICATED_PATCH | Freq: Once | TRANSDERMAL | Status: DC
  Administered 2020-01-21: 1.5 mg via TRANSDERMAL
  Filled 2020-01-21: qty 1

## 2020-01-21 MED ORDER — FENTANYL CITRATE (PF) 100 MCG/2ML IJ SOLN
INTRAMUSCULAR | Status: AC
  Filled 2020-01-21: qty 2

## 2020-01-21 MED ORDER — LIDOCAINE 2% (20 MG/ML) 5 ML SYRINGE
INTRAMUSCULAR | Status: DC | PRN
  Administered 2020-01-21: 40 mg via INTRAVENOUS

## 2020-01-21 MED ORDER — WATER FOR IRRIGATION, STERILE IR SOLN
Status: DC | PRN
  Administered 2020-01-21: 500 mL via SURGICAL_CAVITY

## 2020-01-21 MED ORDER — FENTANYL CITRATE (PF) 250 MCG/5ML IJ SOLN
INTRAMUSCULAR | Status: AC
  Filled 2020-01-21: qty 5

## 2020-01-21 MED ORDER — SODIUM CHLORIDE (PF) 0.9 % IJ SOLN
INTRAMUSCULAR | Status: AC
  Filled 2020-01-21: qty 10

## 2020-01-21 MED ORDER — PROPOFOL 10 MG/ML IV BOLUS
INTRAVENOUS | Status: DC | PRN
  Administered 2020-01-21: 150 mg via INTRAVENOUS

## 2020-01-21 MED ORDER — ROPIVACAINE HCL 5 MG/ML IJ SOLN
INTRAMUSCULAR | Status: AC
  Filled 2020-01-21: qty 30

## 2020-01-21 MED ORDER — FENTANYL CITRATE (PF) 100 MCG/2ML IJ SOLN
INTRAMUSCULAR | Status: DC | PRN
  Administered 2020-01-21 (×2): 100 ug via INTRAVENOUS
  Administered 2020-01-21: 50 ug via INTRAVENOUS

## 2020-01-21 MED ORDER — KETAMINE HCL 10 MG/ML IJ SOLN
INTRAMUSCULAR | Status: AC
  Filled 2020-01-21: qty 1

## 2020-01-21 MED ORDER — SODIUM CHLORIDE 0.9 % IV SOLN
INTRAVENOUS | Status: DC | PRN
  Administered 2020-01-21: 60 mL

## 2020-01-21 MED ORDER — LIDOCAINE-EPINEPHRINE 2 %-1:100000 IJ SOLN
INTRAMUSCULAR | Status: AC
  Filled 2020-01-21: qty 1

## 2020-01-21 MED ORDER — ROCURONIUM BROMIDE 10 MG/ML (PF) SYRINGE
PREFILLED_SYRINGE | INTRAVENOUS | Status: AC
  Filled 2020-01-21: qty 10

## 2020-01-21 MED ORDER — LIDOCAINE 2% (20 MG/ML) 5 ML SYRINGE
INTRAMUSCULAR | Status: AC
  Filled 2020-01-21: qty 5

## 2020-01-21 MED ORDER — BUPIVACAINE HCL (PF) 0.25 % IJ SOLN
INTRAMUSCULAR | Status: DC | PRN
  Administered 2020-01-21: 6 mL

## 2020-01-21 MED ORDER — LACTATED RINGERS IV SOLN: INTRAVENOUS | Status: DC

## 2020-01-21 MED ORDER — CHLORHEXIDINE GLUCONATE 0.12 % MT SOLN
15.0000 mL | Freq: Once | OROMUCOSAL | Status: AC
  Administered 2020-01-21: 15 mL via OROMUCOSAL

## 2020-01-21 MED ORDER — HYDROCODONE-ACETAMINOPHEN 5-325 MG PO TABS: 1.0000 | ORAL_TABLET | ORAL | Status: DC | PRN

## 2020-01-21 MED ORDER — DOCUSATE SODIUM 100 MG PO CAPS
100.0000 mg | ORAL_CAPSULE | Freq: Two times a day (BID) | ORAL | Status: DC
  Administered 2020-01-21 – 2020-01-22 (×2): 100 mg via ORAL
  Filled 2020-01-21 (×3): qty 1

## 2020-01-21 MED ORDER — DEXTROSE-NACL 5-0.45 % IV SOLN: INTRAVENOUS | Status: DC

## 2020-01-21 MED ORDER — CLINDAMYCIN PHOSPHATE 900 MG/50ML IV SOLN
900.0000 mg | INTRAVENOUS | Status: AC
  Administered 2020-01-21: 900 mg via INTRAVENOUS
  Filled 2020-01-21: qty 50

## 2020-01-21 MED ORDER — OXYBUTYNIN CHLORIDE 5 MG PO TABS
5.0000 mg | ORAL_TABLET | Freq: Three times a day (TID) | ORAL | Status: DC | PRN
  Administered 2020-01-21: 5 mg via ORAL
  Filled 2020-01-21: qty 1

## 2020-01-21 MED ORDER — AMLODIPINE BESYLATE 5 MG PO TABS
5.0000 mg | ORAL_TABLET | Freq: Every day | ORAL | Status: DC
  Administered 2020-01-22: 5 mg via ORAL
  Filled 2020-01-21 (×2): qty 1

## 2020-01-21 MED ORDER — GABAPENTIN 100 MG PO CAPS
100.0000 mg | ORAL_CAPSULE | ORAL | Status: AC
  Administered 2020-01-21: 100 mg via ORAL
  Filled 2020-01-21: qty 1

## 2020-01-21 MED FILL — HYDROCODON-APAP 5-325: 5-325 | 2 days supply | Qty: 20 | Fill #0

## 2020-01-21 SURGICAL SUPPLY — 99 items
APPLICATOR ARISTA FLEXITIP XL (MISCELLANEOUS) IMPLANT
BAG DECANTER FOR FLEXI CONT (MISCELLANEOUS) ×5 IMPLANT
BAG URINE DRAIN 2000ML AR STRL (UROLOGICAL SUPPLIES) ×5 IMPLANT
BLADE SURG 15 STRL LF DISP TIS (BLADE) ×3 IMPLANT
BLADE SURG 15 STRL SS (BLADE) ×2
BRIEF STRETCH FOR OB PAD LRG (UNDERPADS AND DIAPERS) ×5 IMPLANT
CABLE HIGH FREQUENCY MONO STRZ (ELECTRODE) ×5 IMPLANT
CATH FOLEY 2WAY SLVR  5CC 14FR (CATHETERS) ×2
CATH FOLEY 2WAY SLVR 5CC 14FR (CATHETERS) ×3 IMPLANT
CHLORAPREP W/TINT 26 (MISCELLANEOUS) ×5 IMPLANT
CNTNR URN SCR LID CUP LEK RST (MISCELLANEOUS) ×3 IMPLANT
CONT SPEC 4OZ STRL OR WHT (MISCELLANEOUS) ×2
COVER MAYO STAND STRL (DRAPES) ×10 IMPLANT
COVER SURGICAL LIGHT HANDLE (MISCELLANEOUS) ×5 IMPLANT
COVER WAND RF STERILE (DRAPES) IMPLANT
DECANTER SPIKE VIAL GLASS SM (MISCELLANEOUS) ×40 IMPLANT
DERMABOND ADVANCED (GAUZE/BANDAGES/DRESSINGS) ×2
DERMABOND ADVANCED .7 DNX12 (GAUZE/BANDAGES/DRESSINGS) ×3 IMPLANT
DEVICE CAPIO SLIM SINGLE (INSTRUMENTS) IMPLANT
DRAIN PENROSE 0.25X18 (DRAIN) ×5 IMPLANT
DRAPE UNDERBUTTOCKS STRL (DISPOSABLE) ×5 IMPLANT
ELECT PENCIL ROCKER SW 15FT (MISCELLANEOUS) ×5 IMPLANT
ELECT REM PT RETURN 15FT ADLT (MISCELLANEOUS) ×5 IMPLANT
FILTER SMOKE EVAC LAPAROSHD (FILTER) IMPLANT
GAUZE 4X4 16PLY RFD (DISPOSABLE) ×15 IMPLANT
GAUZE PACKING 1 X5 YD ST (GAUZE/BANDAGES/DRESSINGS) ×5 IMPLANT
GLOVE BIO SURGEON STRL SZ 6.5 (GLOVE) ×4 IMPLANT
GLOVE BIO SURGEONS STRL SZ 6.5 (GLOVE) ×1
GLOVE BIOGEL M STRL SZ7.5 (GLOVE) ×5 IMPLANT
GLOVE BIOGEL PI IND STRL 7.0 (GLOVE) ×15 IMPLANT
GLOVE BIOGEL PI INDICATOR 7.0 (GLOVE) ×10
GLOVE ECLIPSE 6.5 STRL STRAW (GLOVE) ×10 IMPLANT
GLOVE ECLIPSE 8.5 STRL (GLOVE) ×5 IMPLANT
GOWN STRL REUS W/TWL XL LVL3 (GOWN DISPOSABLE) ×5 IMPLANT
GYRUS RUMI II 2.5CM BLUE (DISPOSABLE) ×5
HEMOSTAT ARISTA ABSORB 3G PWDR (HEMOSTASIS) IMPLANT
HOLDER FOLEY CATH W/STRAP (MISCELLANEOUS) ×5 IMPLANT
IV NS 1000ML (IV SOLUTION) ×2
IV NS 1000ML BAXH (IV SOLUTION) ×3 IMPLANT
KIT BASIN (CUSTOM PROCEDURE TRAY) ×5 IMPLANT
KIT TURNOVER KIT A (KITS) IMPLANT
LIGASURE VESSEL 5MM BLUNT TIP (ELECTROSURGICAL) ×10 IMPLANT
NEEDLE HYPO 22GX1.5 SAFETY (NEEDLE) ×5 IMPLANT
NEEDLE INSUFFLATION 14GA 120MM (NEEDLE) ×5 IMPLANT
NEEDLE MAYO 6 CRC TAPER PT (NEEDLE) ×5 IMPLANT
NS IRRIG 1000ML POUR BTL (IV SOLUTION) ×10 IMPLANT
OCCLUDER COLPOPNEUMO (BALLOONS) ×5 IMPLANT
PACK LAVH (CUSTOM PROCEDURE TRAY) ×5 IMPLANT
PACK TRENDGUARD 450 HYBRID PRO (MISCELLANEOUS) ×3 IMPLANT
PAD OB MATERNITY 4.3X12.25 (PERSONAL CARE ITEMS) ×5 IMPLANT
PLUG CATH AND CAP STER (CATHETERS) ×5 IMPLANT
POUCH LAPAROSCOPIC INSTRUMENT (MISCELLANEOUS) ×10 IMPLANT
PROTECTOR NERVE ULNAR (MISCELLANEOUS) ×15 IMPLANT
RETRACTOR STAY HOOK 5MM (MISCELLANEOUS) ×5 IMPLANT
RUMI II GYRUS 2.5CM BLUE (DISPOSABLE) ×3 IMPLANT
SET CYSTO W/LG BORE CLAMP LF (SET/KITS/TRAYS/PACK) IMPLANT
SET IRRIG TUBING LAPAROSCOPIC (IRRIGATION / IRRIGATOR) ×5 IMPLANT
SHEARS HARMONIC ACE PLUS 36CM (ENDOMECHANICALS) ×5 IMPLANT
SHEET LAVH (DRAPES) ×5 IMPLANT
SLEEVE XCEL OPT CAN 5 100 (ENDOMECHANICALS) ×10 IMPLANT
SUT CAPIO ETHIBPND (SUTURE) IMPLANT
SUT SILK 2 0 30  PSL (SUTURE)
SUT SILK 2 0 30 PSL (SUTURE) IMPLANT
SUT VIC AB 0 CT1 18XCR BRD8 (SUTURE) ×6 IMPLANT
SUT VIC AB 0 CT1 27 (SUTURE) ×2
SUT VIC AB 0 CT1 27XBRD ANTBC (SUTURE) ×3 IMPLANT
SUT VIC AB 0 CT1 36 (SUTURE) ×10 IMPLANT
SUT VIC AB 0 CT1 8-18 (SUTURE) ×4
SUT VIC AB 2-0 CT1 27 (SUTURE) ×4
SUT VIC AB 2-0 CT1 27XBRD (SUTURE) ×6 IMPLANT
SUT VIC AB 2-0 SH 27 (SUTURE) ×4
SUT VIC AB 2-0 SH 27X BRD (SUTURE) ×6 IMPLANT
SUT VIC AB 3-0 PS2 18 (SUTURE) ×4
SUT VIC AB 3-0 PS2 18XBRD (SUTURE) ×6 IMPLANT
SUT VIC AB 3-0 SH 27 (SUTURE) ×2
SUT VIC AB 3-0 SH 27XBRD (SUTURE) ×3 IMPLANT
SUT VICRYL 0 TIES 12 18 (SUTURE) ×5 IMPLANT
SUT VICRYL 0 UR6 27IN ABS (SUTURE) ×20 IMPLANT
SYR 10ML LL (SYRINGE) ×20 IMPLANT
SYR 50ML LL SCALE MARK (SYRINGE) ×10 IMPLANT
SYR BULB IRRIG 60ML STRL (SYRINGE) ×5 IMPLANT
TIP UTERINE 5.1X6CM LAV DISP (MISCELLANEOUS) IMPLANT
TIP UTERINE 6.7X10CM GRN DISP (MISCELLANEOUS) IMPLANT
TIP UTERINE 6.7X6CM WHT DISP (MISCELLANEOUS) IMPLANT
TIP UTERINE 6.7X8CM BLUE DISP (MISCELLANEOUS) ×5 IMPLANT
TOWEL OR 17X26 10 PK STRL BLUE (TOWEL DISPOSABLE) ×15 IMPLANT
TOWEL OR NON WOVEN STRL DISP B (DISPOSABLE) ×5 IMPLANT
TRAY CYSTO PACK (CUSTOM PROCEDURE TRAY) IMPLANT
TRAY FOLEY MTR SLVR 14FR STAT (SET/KITS/TRAYS/PACK) ×5 IMPLANT
TRENDGUARD 450 HYBRID PRO PACK (MISCELLANEOUS) ×5
TROCAR XCEL NON-BLD 5MMX100MML (ENDOMECHANICALS) ×5 IMPLANT
TUBING CONNECTING 10 (TUBING) ×4 IMPLANT
TUBING CONNECTING 10' (TUBING) ×1
TUBING INSUF HEATED (TUBING) ×5 IMPLANT
UNDERPAD 30X36 HEAVY ABSORB (UNDERPADS AND DIAPERS) ×5 IMPLANT
WARMER LAPAROSCOPE (MISCELLANEOUS) ×5 IMPLANT
WATER STERILE IRR 1000ML POUR (IV SOLUTION) ×5 IMPLANT
YANKAUER SUCT BULB TIP 10FT TU (MISCELLANEOUS) ×5 IMPLANT
rumii koh efficient 2.5 for use with rumi ii handle ×5 IMPLANT

## 2020-01-21 NOTE — Progress Notes (Signed)
No pain Looks good Vitals OK  See in am

## 2020-01-21 NOTE — Progress Notes (Signed)
Subjective: Patient reports nausea earlier today.  This has resolved.  Seems to have nausea after IV pain medication.  Has not walked yet due to feeling dizzy.  This has gradually resolved as well.  Reports good pain control.    Objective: I have reviewed patient's vital signs, intake and output, medications and labs. Vitals:   01/21/20 1849 01/21/20 2058  BP: 107/83 (!) 136/97  Pulse: (!) 58 (!) 58  Resp: 18 16  Temp: 97.9 F (36.6 C) 97.6 F (36.4 C)  SpO2: 94% 96%    Post op hb: 13.5 (13.8 pre-op)  General: alert, cooperative and no distress Resp: clear to auscultation bilaterally Cardio: regular rate and rhythm, S1, S2 normal, no murmur, click, rub or gallop GI: incision: clean, dry and intact and soft, ND, NT, quiet BS Extremities: extremities normal, atraumatic, no cyanosis or edema Vaginal Bleeding: none (packing in place)  Assessment/Plan: S/p TLH/BSO, cystoscopy, anterior repair with Dr. Matilde Sprang  1) Encourage ambulation 2) advance diet in AM 3) Hb and BMP in AM 4) voiding trials in the AM   LOS: 0 days    Megan Salon 01/21/2020, 10:02 PM

## 2020-01-21 NOTE — Anesthesia Procedure Notes (Signed)
Procedure Name: Intubation Date/Time: 01/21/2020 7:44 AM Performed by: Claudia Desanctis, CRNA Pre-anesthesia Checklist: Patient identified, Emergency Drugs available, Suction available and Patient being monitored Patient Re-evaluated:Patient Re-evaluated prior to induction Oxygen Delivery Method: Circle system utilized Preoxygenation: Pre-oxygenation with 100% oxygen Induction Type: IV induction Ventilation: Mask ventilation without difficulty Laryngoscope Size: Mac and 3 Grade View: Grade I Tube type: Oral Tube size: 7.0 mm Number of attempts: 1 Airway Equipment and Method: Stylet Placement Confirmation: ETT inserted through vocal cords under direct vision,  positive ETCO2 and breath sounds checked- equal and bilateral Secured at: 21 cm Tube secured with: Tape Dental Injury: Teeth and Oropharynx as per pre-operative assessment  Comments: Intubated by [facility] EMT student.  Mac 3 training blade used with Glidescope visualization by CRNA and MDA

## 2020-01-21 NOTE — Op Note (Addendum)
01/21/2020  10:00 AM  PATIENT:  Joan Morgan  59 y.o. female  PRE-OPERATIVE DIAGNOSIS:  CYSTOCELE VAULT PROLAPSE, INCOMPLETE UTERINE PROLAPSE  POST-OPERATIVE DIAGNOSIS:  cystocele vault prolapse,incomplete  PROCEDURE:  Procedure(s): TOTAL LAPAROSCOPIC HYSTERECTOMY WITH BILATERAL SALPINGO OOPHORECTOMY  SURGEON:  Megan Salon  ASSISTANTS: Josefa Half, MD.  Due to complexity of procedure and lack of regularly available RNFAs, MD assistance was needed with this procedure.  ANESTHESIA:   general  ESTIMATED BLOOD LOSS: 50 mL  BLOOD ADMINISTERED:none   FLUIDS: 1200cc LR  UOP: 150cc clear UOP  SPECIMEN:  Uterus, cervix and bilateral fallopian tubes  DISPOSITION OF SPECIMEN:  PATHOLOGY  FINDINGS: normal ovaries, fallopian tubes with Falope rings present, normal appearing uterus, long but normal appearing appendix, normal upper abdomen, vaginally she has a 3rd degree cystocele but more apical support than was noted in the office.    DESCRIPTION OF OPERATION: Patient is taken to the operating room. She is placed in the supine position. She is a running IV in place. Informed consent was present on the chart. SCDs on her lower extremities and functioning properly. Patient was positioned while she was awake.  Her legs were placed in the low lithotomy position in [place]. Her arms were tucked by the side.  General endotracheal anesthesia was administered by the anesthesia staff without difficulty. Dr. Gifford Shave, anesthesia, oversaw case.  Time out performed.    Chlora prep was then used to prep the abdomen and Betadine was used to prep the inner thighs, perineum and vagina. Once 3 minutes had past the patient was draped in a normal standard fashion. The legs were lifted to the high lithotomy position. The cervix was visualized by placing a heavy weighted speculum in the posterior aspect of the vagina and using a curved Deaver retractor to the retract anteriorly. The anterior lip of the  cervix was grasped with single-tooth tenaculum.  The cervix sounded to 8 cm. Pratt dilators were used to dilate the cervix up to a #21. A RUMI uterine manipulator was obtained. A #8 disposable tip was placed on the RUMI manipulator as well as a 3.0, silver KOH ring. This was passed through the cervix and the bulb of the disposable tip was inflated with 10 cc of normal saline. There was a good fit of the KOH ring around the cervix. The tenaculum was removed. There is also good manipulation of the uterus. The speculum and retractor were removed as well. A Foley catheter was placed to straight drain.  Clear urine was noted. Legs were lowered to the low lithotomy position and attention was turned the abdomen.  The umbilicus was everted.  A Veress needle was obtained. Syringe of sterile saline was placed on a open Veress needle.  This was passed into the umbilicus until just when the fluid started to drip.  Then low flow CO2 gas was attached the needle and the pneumoperitoneum was achieved without difficulty. Once four liters of gas was in the abdomen the Veress needle was removed and a 5 millimeter non-bladed Optiview trocar and port were passed directly to the abdomen. The laparoscope was then used to confirm intraperitoneal placement. Findings are noted above.  Locations for RLQ and LLQ were noted by transillumination of the abdominal wall.  0.25% marcaine was used to anesthetize the skin.  A 4mm skin incision was made and a 5mm nonbladed trochar and port was placed in the RLQ and than again in the LLQ.  All trochars were removed.    Ureters  were identifies.  Attention was turned to the right side.  The right IP ligament was serially clamped, cauterized and incised with the Ligasure device.  Then the right round ligament was serially clamped cauterized and incised. The anterior and posterior peritoneum of the inferior leaf of the broad ligament were opened. The beginning of the bladder flap was created with the  Harmonic scalpel.  The bladder was taken down below the level of the KOH ring. The left uterine artery skeletonized and then just superior to the KOH ring.   Attention was turned the left side.  The uterus was placed on stretch to the opposite side.  The left IP ligament was serially clamped, cauterized and incised with the Ligasure device.  Then the left round ligament was serially clamped cauterized and incised. The anterior posterior peritoneum of the inferiorly for the broad ligament were opened. The anterior peritoneum was carried across to the dissection on the left side. The remainder of the bladder flap was created using the Harmonic scalpel. The bladder was well below the level of the KOH ring. The left uterine artery skeletonized. Then the left uterine artery, above the level of the KOH ring, was serially clamped cauterized and incised. Attention was turned the the right and the uterine artery was clamped, cauterized and incised.  The uterus was devascularized at this point.  At this point, the laparoscopic portion of the procedure was ended.  CO2 pressure was decreased to 32mm Hg.  Irrigation was performed.  No bleeding was noted.  The pneumoperitoneum was relived.  The instruments were all removed at this point.  The trochars were removed.  The incisions were closed with #3.0 Vicryl with a subcuticular stitch.  Then the incisions were cleansed, dried and Dermabond placed.    Attention was then turned below with the legs in the high lithotomy position.  Heavy weight speculum was placed in posterior vaginal. Posterior peritoneum was entered sharply. 1% Lidocaine mixed 1:1 with epinephrine (1:200,000U) was instilled. 10cc total used. Anterior mucosa incised. Pubovesicocervical fascia was incised and anterior cul de sac entered. Then using Heaney clamps, the uterosacral ligaments were clamped, incised, and sutured with 0 Vicryl. The remaining portion of the cardinal ligaments were serially clamped,  incised, and suture ligated to free the uterus.  The specimen was delivered through the vagina.  Pedicles were inspected. No bleeding noted. Irrigation performed. Cuff was run with a 0 Vicryl suture in a running interlocking fashion from the 3 o'clock position to the 9 o'clock position. A uterosacral ligament plication stitch was placed with 0 Vicryl was encorporating the medial third of each uterosacral ligament and reefing across the posterior peritoneum. This stitch was brought through the posterior vaginal mucosa and tagged for Dr. Matilde Sprang.  Cuff was not closed as Dr. Matilde Sprang needed it open the complete his portion of the procedure.   He was present and ready to take over at this point. The Foley catheter was left in place. Sponge, lap, needle, and instrument counts were correct x2. Patient tolerated the first portion of the procedure well.  Dr. Matilde Sprang did complete the cystoscopy before I left the OR and there was not bladder injury and both ureters were seen shooting normal jets of urine.     Sponge, lap, needle, initially counts were correct x2.  COUNTS:  YES  PLAN OF CARE: Transfer to PACU

## 2020-01-21 NOTE — Anesthesia Postprocedure Evaluation (Signed)
Anesthesia Post Note  Patient: Development worker, international aid  Procedure(s) Performed: REPAIR VAGINAL VAULT SUSPENSION AND GRIFT (N/A ) REAPIR ANTERIOR REPAIR (CYSTOCELE) CYSTOSCOPY (N/A Vagina ) TOTAL LAPAROSCOPIC HYSTERECTOMY WITH BILATERAL SALPINGO OOPHORECTOMY (Bilateral Abdomen)     Patient location during evaluation: PACU Anesthesia Type: General Level of consciousness: awake and alert, oriented and awake Pain management: pain level controlled Vital Signs Assessment: post-procedure vital signs reviewed and stable Respiratory status: spontaneous breathing, nonlabored ventilation and respiratory function stable Cardiovascular status: blood pressure returned to baseline and stable Postop Assessment: no apparent nausea or vomiting Anesthetic complications: no   No complications documented.  Last Vitals:  Vitals:   01/21/20 1200 01/21/20 1215  BP: (!) 143/88 (!) 142/88  Pulse: 66 72  Resp: 15 18  Temp:    SpO2: 100% 100%    Last Pain:  Vitals:   01/21/20 1215  TempSrc:   PainSc: Asleep                 Catalina Gravel

## 2020-01-21 NOTE — Transfer of Care (Signed)
Immediate Anesthesia Transfer of Care Note  Patient: Joan Morgan  Procedure(s) Performed: REPAIR VAGINAL VAULT SUSPENSION AND GRIFT (N/A ) REAPIR ANTERIOR REPAIR (CYSTOCELE) CYSTOSCOPY (N/A Vagina ) TOTAL LAPAROSCOPIC HYSTERECTOMY WITH BILATERAL SALPINGO OOPHORECTOMY (Bilateral Abdomen)  Patient Location: PACU  Anesthesia Type:General  Level of Consciousness: awake and patient cooperative  Airway & Oxygen Therapy: Patient Spontanous Breathing and Patient connected to face mask  Post-op Assessment: Report given to RN and Post -op Vital signs reviewed and stable  Post vital signs: Reviewed and stable  Last Vitals:  Vitals Value Taken Time  BP 146/91 01/21/20 1154  Temp    Pulse 78 01/21/20 1156  Resp 22 01/21/20 1156  SpO2 100 % 01/21/20 1156  Vitals shown include unvalidated device data.  Last Pain:  Vitals:   01/21/20 0554  TempSrc: Oral  PainSc: 0-No pain         Complications: No complications documented.

## 2020-01-21 NOTE — Op Note (Signed)
Preoperative diagnosis: Cystocele and mild vaginal cuff prolapse Postoperative. Diagnosis: Midline cystocele and mild vaginal vault prolapse Surgery: Cystocele repair and cystoscopy Surgeon: Dr. Nicki Reaper Keimya Briddell Assistant: Estill Bamberg dancy    The patient has the above diagnosis and consented the above procedure.  Gynecology removed the uterus.  Leg position was good.  Both ureteral sacral ligaments were tagged.  Cuff posteriorly had been run.  Cuff was very well supported with strong ureteral sacral ligaments.  She had a grade 2 moderate sized cystocele with midline defect.  She definitely did not need a vault suspension  20 cc of lidocaine epinephrine mixture was utilized underneath the vaginal epithelium.  I used my Allis clamp technique and made a long anterior vaginal wall incision mobilizing the underlying pubocervical fascia from the vaginal epithelium.  I dissected to the white line bilaterally.  I was pleased with my dissection at the apex.  She had a long anterior vaginal wall  I did a 2 layer anterior repair with 2-0 Vicryl and SH  needle.  I was careful to not distort the anatomy.  I did not imbricate the bladder neck.  It was a very long repair.  I cystoscoped the patient.  Cystoscopically she had an excellent cystocele repair.  No injury to ureters.  Excellent efflux bilaterally  I trimmed an appropriate amount of anterior vaginal wall and closed anterior vaginal wall with running 2-0 Vicryl and CT1 needle.  I closed the apex from right to left and left to right with 0 Vicryl on a CT1 needle.  I cut the ureteral sacral ligaments.  There was a culdoplasty suture double tag in the midline placed by Dr Sabra Heck that I tied before finalizing the closure.  At the end of the case she had excellent support.  She had excellent length.  Blood loss was less than 100 mL.  Hopefully the patient will reach her treatment goal  1 vaginal pack with clindamycin cream apply leg position was good

## 2020-01-21 NOTE — Interval H&P Note (Signed)
History and Physical Interval Note:  01/21/2020 7:15 AM  Joan Morgan  has presented today for surgery, with the diagnosis of CYSTOCELE VAULT PROLAPSE, INCOMPLETE UTERINE PROLAPSE.  The various methods of treatment have been discussed with the patient and family. After consideration of risks, benefits and other options for treatment, the patient has consented to  Procedure(s): REPAIR VAGINAL VAULT SUSPENSION AND GRIFT (N/A) REAPIR ANTERIOR REPAIR (CYSTOCELE) CYSTOSCOPY (N/A) TOTAL LAPAROSCOPIC HYSTERECTOMY WITH BILATERAL SALPINGO OOPHORECTOMY (Bilateral) CYSTOSCOPY (N/A) as a surgical intervention.  The patient's history has been reviewed, patient examined, no change in status, stable for surgery.  I have reviewed the patient's chart and labs.  Questions were answered to the patient's satisfaction.     [name] A [name]

## 2020-01-21 NOTE — H&P (Signed)
Joan Morgan is an 59 y.o. female [place] Female with incomplete uterine prolapse and cystocele here for surgical correction.  She has seen Dr. Matilde Sprang in consultation as well and combined procedure with him is planned.  He will plan to repair her cystocele and possibly support the vaginal apex.  Procedure, risks, benefits, alternatives have been explained.  This is documented in 12/31/2019 noted.  She is not interested in using a pessary for conservative treatment.      Pertinent Gynecological History: Menses: post-menopausal Bleeding: none Contraception: post menopausal status DES exposure: denies Blood transfusions: none Sexually transmitted diseases: no past history Previous GYN Procedures: none  Last mammogram: normal Date: 01/2019 Last pap: normal Date: 09/2018 OB History: G2, P2   Menstrual History: Patient's last menstrual period was 08/01/2009 (approximate).    Past Medical History:  Diagnosis Date  . Anemia   . Hyperlipidemia   . Hypertension   . Pre-diabetes   . Prediabetes     Past Surgical History:  Procedure Laterality Date  . TUBAL LIGATION      Family History  Problem Relation Age of Onset  . Hyperlipidemia Mother   . Hypertension Mother   . Aneurysm Mother   . Hyperlipidemia Father   . Breast cancer Neg Hx     Social History:  reports that she has never smoked. She has never used smokeless tobacco. She reports that she does not drink alcohol and does not use drugs.  Allergies:  Allergies  Allergen Reactions  . Ace Inhibitors     cough    Medications Prior to Admission  Medication Sig Dispense Refill Last Dose  . amLODipine (NORVASC) 5 MG tablet TAKE 1 TABLET (5 MG TOTAL) BY MOUTH DAILY. (Patient taking differently: Take 5 mg by mouth daily. TAKE 1 TABLET (5 MG TOTAL) BY MOUTH DAILY.) 90 tablet 3 01/21/2020 at 0445  . aspirin 81 MG chewable tablet Chew 81 mg by mouth daily.   Past Week at Unknown time  . atorvastatin (LIPITOR) 20 MG tablet  Take 1 tablet (20 mg total) by mouth daily at 6 PM. 90 tablet 3 01/20/2020 at Unknown time  . hydrochlorothiazide (HYDRODIURIL) 12.5 MG tablet Take 1 tablet (12.5 mg total) by mouth daily. In am 90 tablet 3 01/20/2020 at Unknown time  . Multiple Vitamin (MULTIVITAMIN WITH MINERALS) TABS tablet Take 1 tablet by mouth daily.   Past Week at Unknown time  . Omega-3 Fatty Acids (FISH OIL) 1000 MG CAPS Take 1 capsule by mouth daily. Take one by mouth daily    Past Week at Unknown time  . VITAMIN D PO Take 1,000 Int'l Units by mouth daily.   Past Week at Unknown time    Review of Systems  All other systems reviewed and are negative.   Blood pressure (!) 150/91, pulse 61, temperature 98.3 F (36.8 C), temperature source Oral, resp. rate 16, last menstrual period 08/01/2009, SpO2 98 %. Physical Exam  Constitutional: She is oriented to person, place, and time.  Cardiovascular: Normal rate, regular rhythm, normal heart sounds and normal pulses.  GI: Normal appearance.  Neurological: She is alert and oriented to person, place, and time.  Psychiatric: Mood normal.    No results found for this or any previous visit (from the past 24 hour(s)).  No results found.  Assessment/Plan: [place] female with cystocele and incomplete uterine prolapse here for definitive surgical treatment.  Combined procedure with Dr. Matilde Sprang is planned.  I will be performing TLH/BSO, cystoscopy and  then Dr. Matilde Sprang will complete the procedure.  Pt ready to proceed.  Megan Salon 01/21/2020, 7:11 AM

## 2020-01-22 ENCOUNTER — Encounter (HOSPITAL_COMMUNITY): Payer: Self-pay | Admitting: Urology

## 2020-01-22 DIAGNOSIS — D282 Benign neoplasm of uterine tubes and ligaments: Secondary | ICD-10-CM | POA: Diagnosis not present

## 2020-01-22 DIAGNOSIS — E785 Hyperlipidemia, unspecified: Secondary | ICD-10-CM | POA: Diagnosis not present

## 2020-01-22 DIAGNOSIS — Z7982 Long term (current) use of aspirin: Secondary | ICD-10-CM | POA: Diagnosis not present

## 2020-01-22 DIAGNOSIS — N72 Inflammatory disease of cervix uteri: Secondary | ICD-10-CM | POA: Diagnosis not present

## 2020-01-22 DIAGNOSIS — N888 Other specified noninflammatory disorders of cervix uteri: Secondary | ICD-10-CM | POA: Diagnosis not present

## 2020-01-22 DIAGNOSIS — Z79899 Other long term (current) drug therapy: Secondary | ICD-10-CM | POA: Diagnosis not present

## 2020-01-22 DIAGNOSIS — R7303 Prediabetes: Secondary | ICD-10-CM | POA: Diagnosis not present

## 2020-01-22 DIAGNOSIS — N812 Incomplete uterovaginal prolapse: Secondary | ICD-10-CM | POA: Diagnosis not present

## 2020-01-22 DIAGNOSIS — I10 Essential (primary) hypertension: Secondary | ICD-10-CM | POA: Diagnosis not present

## 2020-01-22 LAB — BASIC METABOLIC PANEL
Anion gap: 9 (ref 5–15)
BUN: 9 mg/dL (ref 6–20)
CO2: 28 mmol/L (ref 22–32)
Calcium: 8.9 mg/dL (ref 8.9–10.3)
Chloride: 105 mmol/L (ref 98–111)
Creatinine, Ser: 0.63 mg/dL (ref 0.44–1.00)
GFR calc Af Amer: >60 mL/min (ref >60)
GFR calc non Af Amer: >60 mL/min (ref >60)
Glucose, Bld: 123 mg/dL — ABNORMAL HIGH (ref 70–99)
Potassium: 4.2 mmol/L (ref 3.5–5.1)
Sodium: 142 mmol/L (ref 135–145)

## 2020-01-22 LAB — SURGICAL PATHOLOGY

## 2020-01-22 LAB — HEMOGLOBIN AND HEMATOCRIT, BLOOD
HCT: 38.8 % (ref 36.0–46.0)
Hemoglobin: 12.8 g/dL (ref 12.0–15.0)

## 2020-01-22 NOTE — Discharge Summary (Signed)
Date of admission: 01/21/2020  Date of discharge: 01/22/2020  Admission diagnosis: midline cystocele  Discharge diagnosis: midline cystocele  Secondary diagnoses: uterine prolapse  History and Physical: For full details, please see admission history and physical. Briefly, Joan Morgan is a 59 y.o. year old patient with above diagnosis.   Hospital Course: Hysterectomy and ovaries removed, cystocele repair and cysto. Excellent post op course  Laboratory values:  Recent Labs    01/21/20 1558 01/22/20 0600  HGB 13.5 12.8  HCT 40.8 38.8   Recent Labs    01/22/20 0600  CREATININE 0.63    Disposition: Home  Discharge instruction: The patient was instructed to be ambulatory but told to refrain from heavy lifting, strenuous activity, or driving. Detailed  Discharge medications:  Allergies as of 01/22/2020      Reactions   Ace Inhibitors    cough      Medication List    STOP taking these medications   aspirin 81 MG chewable tablet   Fish Oil 1000 MG Caps   multivitamin with minerals Tabs tablet   VITAMIN D PO     TAKE these medications   amLODipine 5 MG tablet Commonly known as: NORVASC TAKE 1 TABLET (5 MG TOTAL) BY MOUTH DAILY. What changed:   how much to take  how to take this  when to take this   atorvastatin 20 MG tablet Commonly known as: LIPITOR Take 1 tablet (20 mg total) by mouth daily at 6 PM.   hydrochlorothiazide 12.5 MG tablet Commonly known as: HYDRODIURIL Take 1 tablet (12.5 mg total) by mouth daily. In am   HYDROcodone-acetaminophen 5-325 MG tablet Commonly known as: Norco Take 1-2 tablets by mouth every 6 (six) hours as needed for moderate pain.       Followup:   Follow-up Information    Maeson Purohit, Nicki Reaper, MD Follow up.   Specialty: Urology Why: Office will call you with date and time of appt.  Contact information: [place] [phone]

## 2020-01-22 NOTE — Progress Notes (Signed)
1 Day Post-Op Subjective: The patient is doing well.  No nausea or vomiting. Pain is adequately controlled on tylenol.  Objective: Vital signs in last 24 hours: Temp:  [97.4 F (36.3 C)-98.7 F (37.1 C)] 98.7 F (37.1 C) (06/23 0550) Pulse Rate:  [51-77] 54 (06/23 0550) Resp:  [14-18] 16 (06/23 0550) BP: (107-146)/(71-97) 112/77 (06/23 0550) SpO2:  [94 %-100 %] 95 % (06/23 0550) Weight:  [69.1 kg] 69.1 kg (06/22 2110)  Intake/Output from previous day: 06/22 0701 - 06/23 0700 In: 2634 [I.V.:2475.7; IV Piggyback:158.3] Out: 4200 [Urine:4150; Blood:50] Intake/Output this shift: No intake/output data recorded.  Physical Exam:  General: Alert and oriented. CV: RRR Lungs: Clear bilaterally. GI: Soft, Nondistended. Incisions: Clean and dry. Urine: Clear yellow Extremities: Nontender, no erythema, no edema.  Lab Results: Recent Labs    01/21/20 1558 01/22/20 0600  HGB 13.5 12.8  HCT 40.8 38.8          Recent Labs    01/15/20 1440 01/22/20 0600  CREATININE 0.82 0.63           Results for orders placed or performed during the hospital encounter of 01/21/20 (from the past 24 hour(s))  Hemoglobin and hematocrit, blood     Status: None   Collection Time: 01/21/20  3:58 PM  Result Value Ref Range   Hemoglobin 13.5 12.0 - 15.0 g/dL   HCT 40.8 36 - 46 %  Basic metabolic panel     Status: Abnormal   Collection Time: 01/22/20  6:00 AM  Result Value Ref Range   Sodium 142 135 - 145 mmol/L   Potassium 4.2 3.5 - 5.1 mmol/L   Chloride 105 98 - 111 mmol/L   CO2 28 22 - 32 mmol/L   Glucose, Bld 123 (H) 70 - 99 mg/dL   BUN 9 6 - 20 mg/dL   Creatinine, Ser 0.63 0.44 - 1.00 mg/dL   Calcium 8.9 8.9 - 10.3 mg/dL   GFR calc non Af Amer >60 >60 mL/min   GFR calc Af Amer >60 >60 mL/min   Anion gap 9 5 - 15  Hemoglobin and hematocrit, blood     Status: None   Collection Time: 01/22/20  6:00 AM  Result Value Ref Range   Hemoglobin 12.8 12.0 - 15.0 g/dL   HCT 38.8 36 - 46 %     Assessment/Plan: POD# 1 s/p hysterectomy and cystocele repair   1) Ambulate, Incentive spirometry 2) Regular diet, medlock 3) D/C urethral catheter, trial of void 4) Remove vaginal packing      LOS: 0 days   Carmie Kanner 01/22/2020, 7:02 AM

## 2020-01-22 NOTE — Discharge Instructions (Signed)
As discussed with Dr. Kaylyn Layer may resume aspirin, advil, aleve, vitamins, and supplements 7 days after surgery.  I detailed them to patient   I have reviewed discharge instructions in detail with the patient. They will follow-up with me or their physician as scheduled. My nurse will also be calling the patients as per protocol.

## 2020-01-22 NOTE — Progress Notes (Signed)
All discharge information including follow-up, medication and prescription meds provided to patient. Patient was able to void 2x with no issue and bladder scan volume reported to Urologist. IV removed. No further question ask. Patient waiting to be picked up by son.

## 2020-01-22 NOTE — Progress Notes (Signed)
1 Day Post-Op Procedure(s) (LRB): REPAIR VAGINAL VAULT SUSPENSION AND GRIFT (N/A) REAPIR ANTERIOR REPAIR (CYSTOCELE) CYSTOSCOPY (N/A) TOTAL LAPAROSCOPIC HYSTERECTOMY WITH BILATERAL SALPINGO OOPHORECTOMY (Bilateral)  Subjective: Patient reports no nausea.  Did not walk last night but wants to now.  Has ordered breakfast.  Took only tylenol last night as the dilaudid made her have nausea.  No dizziness.  Miminmal bleeding.  Objective: I have reviewed patient's vital signs, intake and output, medications and labs. Vitals:   01/22/20 0217 01/22/20 0550  BP: 115/71 112/77  Pulse: (!) 51 (!) 54  Resp: 16 16  Temp: 97.9 F (36.6 C) 98.7 F (37.1 C)  SpO2: 99% 95%    General: alert, cooperative and no distress Resp: clear to auscultation bilaterally Cardio: regular rate and rhythm, S1, S2 normal, no murmur, click, rub or gallop GI: soft, non-tender; bowel sounds normal; no masses,  no organomegaly and incision: clean and dry Extremities: extremities normal, atraumatic, no cyanosis or edema and SCDS on Vaginal Bleeding: minimal Packing and foley catheter removed  Assessment: s/p Procedure(s): REPAIR VAGINAL VAULT SUSPENSION AND GRIFT (N/A) REAPIR ANTERIOR REPAIR (CYSTOCELE) CYSTOSCOPY (N/A) TOTAL LAPAROSCOPIC HYSTERECTOMY WITH BILATERAL SALPINGO OOPHORECTOMY (Bilateral): progressing well  Plan: Advance diet Encourage ambulation Advance to PO medication Discontinue IV fluids bladder trials this am  LOS: 0 days    Joan Morgan 01/22/2020, 8:19 AM

## 2020-01-28 ENCOUNTER — Ambulatory Visit: Payer: 59 | Admitting: Obstetrics & Gynecology

## 2020-01-29 DIAGNOSIS — N8111 Cystocele, midline: Secondary | ICD-10-CM | POA: Diagnosis not present

## 2020-02-11 ENCOUNTER — Ambulatory Visit: Payer: 59 | Admitting: Obstetrics & Gynecology

## 2020-02-13 ENCOUNTER — Encounter: Payer: Self-pay | Admitting: Obstetrics & Gynecology

## 2020-02-13 ENCOUNTER — Telehealth: Payer: Self-pay

## 2020-02-13 NOTE — Telephone Encounter (Signed)
Reviewed with Dr. Sabra Heck.  Call returned to patient. OV scheduled for 02/14/20 at 1pm with Dr. Sabra Heck. Will also keep post-op OV as scheduled for 02/18/20 for now, can cancel in office if not needed. Patient agreeable.   Routing to provider for final review. Patient is agreeable to disposition. Will close encounter.

## 2020-02-13 NOTE — Progress Notes (Signed)
GYNECOLOGY  VISIT  CC:   Bloating, constipation  HPI: 59 y.o. G42P2002 Married [place] female here for trouble having bowel movements since her surgery.  Underwent TLH/BSO/cystoscoyp and anterior repair on 01/21/2020.  Having minimal bleeding or spotting.  Voiding well.    Pt reports hx of constipation issues in the past.  She is passing gas and having BMS but just not regularly.  She is taking Colace to three times a day.  Reports Monday she did nto have a BM but then Tuesday she had a small one.  Again Wednesday, she did not have a BMd but yesterday had two and today she's had another bowel movement.  Is feeling better the last 24 hours due to having more bowel movements.  Denies nausea or emesis.  Considering going back to work at the end of next week.  GYNECOLOGIC HISTORY: Patient's last menstrual period was 08/01/2009 (approximate). Contraception: post menopausal Menopausal hormone therapy: none  Patient Active Problem List   Diagnosis Date Noted  . Prediabetes 12/31/2019  . Chronic pain of both knees 06/20/2019  . Neck stiffness 12/06/2017  . Constipation 08/16/2017  . Annual physical exam 11/18/2015  . Hyperlipidemia 10/14/2014  . Hypertension     Past Medical History:  Diagnosis Date  . Anemia   . Hyperlipidemia   . Hypertension   . Pre-diabetes     Past Surgical History:  Procedure Laterality Date  . CYSTOCELE REPAIR N/A 01/21/2020   Procedure: REAPIR ANTERIOR REPAIR (CYSTOCELE) CYSTOSCOPY;  Surgeon: Bjorn Loser, MD;  Location: [facility];  Service: Urology;  Laterality: N/A;  . TOTAL LAPAROSCOPIC HYSTERECTOMY WITH BILATERAL SALPINGO OOPHORECTOMY Bilateral 01/21/2020   Procedure: TOTAL LAPAROSCOPIC HYSTERECTOMY WITH BILATERAL SALPINGO OOPHORECTOMY;  Surgeon: Megan Salon, MD;  Location: [facility];  Service: Gynecology;  Laterality: Bilateral;  . TUBAL LIGATION    . VAGINAL PROLAPSE REPAIR N/A 01/21/2020   Procedure: REPAIR VAGINAL VAULT SUSPENSION AND GRIFT;  Surgeon:  Bjorn Loser, MD;  Location: [facility];  Service: Urology;  Laterality: N/A;    MEDS:   Current Outpatient Medications on File Prior to Visit  Medication Sig Dispense Refill  . amLODipine (NORVASC) 5 MG tablet TAKE 1 TABLET (5 MG TOTAL) BY MOUTH DAILY. (Patient taking differently: Take 5 mg by mouth daily. TAKE 1 TABLET (5 MG TOTAL) BY MOUTH DAILY.) 90 tablet 3  . atorvastatin (LIPITOR) 20 MG tablet Take 1 tablet (20 mg total) by mouth daily at 6 PM. 90 tablet 3  . hydrochlorothiazide (HYDRODIURIL) 12.5 MG tablet Take 1 tablet (12.5 mg total) by mouth daily. In am 90 tablet 3   No current facility-administered medications on file prior to visit.    ALLERGIES: Ace inhibitors  Family History  Problem Relation Age of Onset  . Hyperlipidemia Mother   . Hypertension Mother   . Aneurysm Mother   . Hyperlipidemia Father   . Breast cancer Neg Hx     SH:  Married, non smoker  Review of Systems  Constitutional: Negative.   HENT: Negative.   Eyes: Negative.   Respiratory: Negative.   Cardiovascular: Negative.   Gastrointestinal: Positive for constipation.  Endocrine: Negative.   Genitourinary: Negative.   Musculoskeletal: Negative.   Skin: Negative.   Allergic/Immunologic: Negative.   Neurological: Negative.   Hematological: Negative.   Psychiatric/Behavioral: Negative.     PHYSICAL EXAMINATION:    BP 126/80   Pulse 68   Resp 16   Wt 146 lb (66.2 kg)   LMP 08/01/2009 (Approximate)   BMI  25.86 kg/m     General appearance: alert, cooperative and appears stated age Abdomen: soft, non-tender; bowel sounds normal; no masses,  no organomegal Inc:  C/D/I Lymph:  no inguinal LAD noted  Pelvic: External genitalia:  no lesions              Urethra:  normal appearing urethra with no masses, tenderness or lesions              Bartholins and Skenes: normal                 Vagina: normal appearing vagina with normal color and discharge, no lesions, suture line intact and healing  well              Cervix: absent              Bimanual Exam:  Uterus:  uterus absent              Adnexa: no mass, fullness, tenderness  Chaperone, Royal Hawthorn, CMA, was present for exam.  Assessment: Post op constipation, otherwise doing well  Plan: Pt is going to start daily miralax in additional to colace.  Advised to monitor closely and if starting to have any loose stools, decrease to every other or every 3rd day.

## 2020-02-13 NOTE — Telephone Encounter (Signed)
Spoke with patient. Patient increased from 2 stool softener daily to 3 daily four days ago, was not having a BM daily. Reports BM q other day, last BM this morning, reports it was hard. Reports increased bloating and pain in her lower back. Reports abdomen is distended, is "not hard, not soft". Denies N/V, fever/chills, vaginal bleeding or urinary symptoms. Took tylenol for pain a few days ago, never took Norco prescribed for post-op. Is eating well and has increased her fluids, no change in symptoms.   Patient is asking what else she can take? Advised I will review with Dr. Sabra Heck and f/u with recommendations, patient agreeable.   Dr. Sabra Heck -please advise.

## 2020-02-13 NOTE — Telephone Encounter (Signed)
Joan Morgan, Joan "Speed"  P [facility] Clinical [facility] Dr.Miller,  These few days I have been having bloating problem, it seems so much gas could not come out. It cause me back pain. Is anything I can take to get the gas out and ease the pain beside tylenol?  I am still taking stool softener but I still can not go everyday.  Please advice.   Thank you,  Joan Morgan

## 2020-02-13 NOTE — Telephone Encounter (Signed)
Patient returned a call to Jill.   

## 2020-02-13 NOTE — Telephone Encounter (Signed)
Left message to call Sharee Pimple, RN at [facility].   S/P TLH w/ BSO and repair vaginal vault suspension, anterior repair and cysto 01/21/20

## 2020-02-14 ENCOUNTER — Ambulatory Visit (INDEPENDENT_AMBULATORY_CARE_PROVIDER_SITE_OTHER): Payer: 59 | Admitting: Obstetrics & Gynecology

## 2020-02-14 ENCOUNTER — Other Ambulatory Visit: Payer: Self-pay

## 2020-02-14 ENCOUNTER — Encounter: Payer: Self-pay | Admitting: Obstetrics & Gynecology

## 2020-02-14 VITALS — BP 126/80 | HR 68 | Resp 16 | Wt 146.0 lb

## 2020-02-14 DIAGNOSIS — K5900 Constipation, unspecified: Secondary | ICD-10-CM

## 2020-02-18 ENCOUNTER — Ambulatory Visit: Payer: 59 | Admitting: Obstetrics & Gynecology

## 2020-02-28 ENCOUNTER — Telehealth: Payer: Self-pay

## 2020-02-28 NOTE — Telephone Encounter (Signed)
Patient called to cancel 6 week post op. Patient stated "had no concerns and was told it was okay to cancel if she was not having problems".

## 2020-03-03 ENCOUNTER — Ambulatory Visit: Payer: 59 | Admitting: Obstetrics & Gynecology

## 2020-03-13 MED FILL — HYDROCHLOROTHIAZIDE 12.5 MG: 12.5 | 90 days supply | Qty: 90 | Fill #1

## 2020-03-13 MED FILL — AMLODIPINE BESYLATE 2.5 MG: 2.5 | 90 days supply | Qty: 90 | Fill #3

## 2020-03-16 ENCOUNTER — Other Ambulatory Visit: Payer: Self-pay | Admitting: Internal Medicine

## 2020-03-16 ENCOUNTER — Telehealth: Payer: Self-pay | Admitting: Internal Medicine

## 2020-03-16 DIAGNOSIS — Z1231 Encounter for screening mammogram for malignant neoplasm of breast: Secondary | ICD-10-CM

## 2020-03-16 NOTE — Telephone Encounter (Addendum)
Patient is calling and requesting a TOC from Dr. Terese Door to Dr. Bryan Lemma, please advise. CB is [phone]

## 2020-03-17 NOTE — Telephone Encounter (Signed)
This is ok she lives in [place] now

## 2020-03-18 NOTE — Telephone Encounter (Signed)
Ok with me 

## 2020-03-25 ENCOUNTER — Encounter: Payer: Self-pay | Admitting: Obstetrics & Gynecology

## 2020-03-25 ENCOUNTER — Encounter: Payer: Self-pay | Admitting: Internal Medicine

## 2020-03-25 DIAGNOSIS — E785 Hyperlipidemia, unspecified: Secondary | ICD-10-CM

## 2020-03-26 MED ORDER — ATORVASTATIN CALCIUM 20 MG PO TABS: 20.0000 mg | ORAL_TABLET | ORAL | 0 refills | Status: DC

## 2020-03-26 MED FILL — ATORVASTATIN CALCIUM 20 MG: 20 | 84 days supply | Qty: 36 | Fill #0

## 2020-04-02 DIAGNOSIS — H524 Presbyopia: Secondary | ICD-10-CM | POA: Diagnosis not present

## 2020-04-20 ENCOUNTER — Other Ambulatory Visit: Payer: Self-pay

## 2020-04-20 ENCOUNTER — Ambulatory Visit
Admission: RE | Admit: 2020-04-20 | Discharge: 2020-04-20 | Disposition: A | Discharge status: Home / Self Care | Payer: 59 | Source: Ambulatory Visit | Attending: Internal Medicine | Admitting: Internal Medicine

## 2020-04-20 DIAGNOSIS — Z1231 Encounter for screening mammogram for malignant neoplasm of breast: Secondary | ICD-10-CM | POA: Insufficient documentation

## 2020-04-22 DIAGNOSIS — R350 Frequency of micturition: Secondary | ICD-10-CM | POA: Diagnosis not present

## 2020-04-22 DIAGNOSIS — N8111 Cystocele, midline: Secondary | ICD-10-CM | POA: Diagnosis not present

## 2020-06-11 MED FILL — HYDROCHLOROTHIAZIDE 12.5 MG: 12.5 | 90 days supply | Qty: 90 | Fill #2

## 2020-06-11 MED FILL — AMLODIPINE BESYLATE 5 MG TA: 5 | 90 days supply | Qty: 90 | Fill #0

## 2020-06-18 ENCOUNTER — Other Ambulatory Visit: Payer: Self-pay

## 2020-06-19 ENCOUNTER — Encounter: Payer: 59 | Admitting: Family Medicine

## 2020-06-19 ENCOUNTER — Encounter: Payer: 59 | Admitting: Internal Medicine

## 2020-06-19 DIAGNOSIS — Z20822 Contact with and (suspected) exposure to covid-19: Secondary | ICD-10-CM | POA: Diagnosis not present

## 2020-07-10 DIAGNOSIS — Z20822 Contact with and (suspected) exposure to covid-19: Secondary | ICD-10-CM | POA: Diagnosis not present

## 2020-08-21 ENCOUNTER — Encounter: Payer: 59 | Admitting: Nurse Practitioner

## 2020-09-22 ENCOUNTER — Other Ambulatory Visit: Payer: Self-pay

## 2020-09-23 ENCOUNTER — Encounter: Payer: Self-pay | Admitting: Family Medicine

## 2020-09-23 ENCOUNTER — Ambulatory Visit (INDEPENDENT_AMBULATORY_CARE_PROVIDER_SITE_OTHER): Payer: 59 | Admitting: Family Medicine

## 2020-09-23 ENCOUNTER — Other Ambulatory Visit: Payer: Self-pay | Admitting: Family Medicine

## 2020-09-23 VITALS — BP 136/84 | HR 63 | Temp 97.5°F | Ht 63.0 in | Wt 144.8 lb

## 2020-09-23 DIAGNOSIS — E782 Mixed hyperlipidemia: Secondary | ICD-10-CM

## 2020-09-23 DIAGNOSIS — R7303 Prediabetes: Secondary | ICD-10-CM | POA: Diagnosis not present

## 2020-09-23 DIAGNOSIS — I10 Essential (primary) hypertension: Secondary | ICD-10-CM

## 2020-09-23 DIAGNOSIS — K5900 Constipation, unspecified: Secondary | ICD-10-CM | POA: Diagnosis not present

## 2020-09-23 MED ORDER — AMLODIPINE BESYLATE 5 MG PO TABS: ORAL_TABLET | ORAL | 3 refills | Status: DC

## 2020-09-23 MED ORDER — HYDROCHLOROTHIAZIDE 12.5 MG PO TABS: 12.5000 mg | ORAL_TABLET | Freq: Every day | ORAL | 3 refills | Status: DC

## 2020-09-23 MED FILL — AMLODIPINE BESYLATE 5 MG TA: 5 | 90 days supply | Qty: 90 | Fill #0

## 2020-09-23 MED FILL — HYDROCHLOROTHIAZIDE 12.5 MG: 12.5 | 90 days supply | Qty: 90 | Fill #0

## 2020-09-23 NOTE — Progress Notes (Signed)
[facility] [address] [place] [address] Dept: [phone] Dept Fax: [phone]  New Patient Office Visit  Subjective:    Patient ID: Joan Morgan, female    DOB: Feb 21, 1961, 60 y.o..   MRN: [id number]  Chief Complaint  Patient presents with  . Transitions Of Care    TOC from Dr. Aundra Dubin, patient that's that she stopped taking cholesterol medication about 6 months ago due to muscle weakness from it.     History of Present Illness:  Patient is in today to establish care. She was previously being seen in [place]. Joan Morgan immigrated to the [place] from [place]. She is married. Her parents live in [place]. Her father has some dementia. Her mother is bed-ridden due to a priro stroke. 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  Joan Morgan has a history of hypertension. She was previously treated with lisinopril for 8 years, but developed a cough that was attributed to the medication. She is now managed with amlodipine and HCTZ.  Joan Morgan has a history of hyperlipidemia. She was on atorvastatin. She was having muscle achiness that she attributed to the medication. She also had read that statins can increase blood sugar. Since she had been identified with prediabetes, she thought the atorvastatin may have contributed to this. For both these reasons, she stopped this medication last summer. She admits that she still gets some muscle achiness with weather changes.  Joan Morgan has a history of constipation. She notes that this is doing better than in the past. She does include fresh fruits and vegetables regularly in her diet. As well, she drinks a tea from [place] that she feels may be helping this. She also has noted some ridges in her finger nails. She states her eyebrows have been thin for many years. She denies any cold intolerance.  Joan Morgan had a laparoscopic hysterectomy and BSO  in July 2021. She was having issues with pelvic floor relaxation. She also had a bladder suspension at the same time. She denies any current issues with incontinence.  Past Medical History: Patient Active Problem List   Diagnosis Date Noted  . Prediabetes 12/31/2019  . Chronic pain of both knees 06/20/2019  . Neck stiffness 12/06/2017  . Constipation 08/16/2017  . Hyperlipidemia 10/14/2014  . Hypertension    Past Surgical History:  Procedure Laterality Date  . CYSTOCELE REPAIR N/A 01/21/2020   Procedure: REAPIR ANTERIOR REPAIR (CYSTOCELE) CYSTOSCOPY;  Surgeon: Bjorn Loser, MD;  Location: [facility];  Service: Urology;  Laterality: N/A;  . TOTAL LAPAROSCOPIC HYSTERECTOMY WITH BILATERAL SALPINGO OOPHORECTOMY Bilateral 01/21/2020   Procedure: TOTAL LAPAROSCOPIC HYSTERECTOMY WITH BILATERAL SALPINGO OOPHORECTOMY;  Surgeon: Megan Salon, MD;  Location: [facility];  Service: Gynecology;  Laterality: Bilateral;  . TUBAL LIGATION    . VAGINAL PROLAPSE REPAIR N/A 01/21/2020   Procedure: REPAIR VAGINAL VAULT SUSPENSION AND GRIFT;  Surgeon: Bjorn Loser, MD;  Location: [facility];  Service: Urology;  Laterality: N/A;   Family History  Problem Relation Age of Onset  . Hyperlipidemia Mother   . Hypertension Mother   . Aneurysm Mother   . Hyperlipidemia Father   . Breast cancer Neg Hx    Outpatient Medications Prior to Visit  Medication Sig Dispense Refill  . amLODipine (NORVASC) 5 MG tablet TAKE 1 TABLET (5 MG TOTAL) BY MOUTH DAILY. (Patient taking differently: Take 5 mg by mouth daily. TAKE 1 TABLET (5 MG TOTAL) BY MOUTH DAILY.) 90 tablet 3  .  hydrochlorothiazide (HYDRODIURIL) 12.5 MG tablet Take 1 tablet (12.5 mg total) by mouth daily. In am 90 tablet 3  . atorvastatin (LIPITOR) 20 MG tablet Take 1 tablet (20 mg total) by mouth 3 (three) times a week. Nightly (Patient not taking: Reported on 09/23/2020) 40 tablet 0   No facility-administered medications prior to visit.   Allergies  Allergen  Reactions  . Ace Inhibitors     cough     Objective:   Today's Vitals   09/23/20 1532  BP: 136/84  Pulse: 63  Temp: (!) 97.5 F (36.4 C)  TempSrc: Temporal  SpO2: 96%  Weight: 144 lb 12.8 oz (65.7 kg)  Height: 5\' 3"  (1.6 m)   Body mass index is 25.65 kg/m.   General: Well developed, well nourished. No acute distress. HEENT: Thinning of the eye brows laterally. Skin: Warm and dry. No rashes. Nails have longitudinal ridges. Psych: Alert and oriented. Normal mood and affect.  There are no preventive care reminders to display for this patient.    Assessment & Plan:   1. Essential hypertension Blood pressure is at goal. We will continue amlodipine and HCTZ. Reassess in 3 months.  - amLODipine (NORVASC) 5 MG tablet; TAKE 1 TABLET (5 MG TOTAL) BY MOUTH DAILY.  Dispense: 90 tablet; Refill: 3 - hydrochlorothiazide (HYDRODIURIL) 12.5 MG tablet; Take 1 tablet (12.5 mg total) by mouth daily. In am  Dispense: 90 tablet; Refill: 3  2. Mixed hyperlipidemia The muscles achiness may have been due to statin use, but since she has ongoing achiness at times, it is uncertain. I recommend we check the current lipid levels and then discuss potential other lipid lowering drugs.  - Lipid panel; Future  4. Constipation, unspecified constipation type This is long standing. Because of the combination of constipation, eyebrow thinning, and nail changes, I will check a TSH to screen for thyroid disease. Otherwise, her management of this with dietary fiber and her [place] tea seems appropriate.  - TSH; Future  5. Prediabetes We will monitor to see if this is improved off of the statin.  - Hemoglobin A1c; Future  I reassured Joan Morgan that the nail ridging appears to be a normal variant in some people and not a sign of disease.  Haydee Salter, MD

## 2020-09-24 ENCOUNTER — Other Ambulatory Visit (INDEPENDENT_AMBULATORY_CARE_PROVIDER_SITE_OTHER): Payer: 59

## 2020-09-24 DIAGNOSIS — R7303 Prediabetes: Secondary | ICD-10-CM

## 2020-09-24 DIAGNOSIS — E782 Mixed hyperlipidemia: Secondary | ICD-10-CM

## 2020-09-24 DIAGNOSIS — K5900 Constipation, unspecified: Secondary | ICD-10-CM | POA: Diagnosis not present

## 2020-09-24 LAB — TSH: TSH: 1.63 u[IU]/mL (ref 0.35–4.50)

## 2020-09-24 LAB — LIPID PANEL
Cholesterol: 252 mg/dL — ABNORMAL HIGH (ref 0–200)
HDL: 56.4 mg/dL (ref >39.00)
LDL Cholesterol: 171 mg/dL — ABNORMAL HIGH (ref 0–99)
NonHDL: 195.53
Total CHOL/HDL Ratio: 4
Triglycerides: 122 mg/dL (ref 0.0–149.0)
VLDL: 24.4 mg/dL (ref 0.0–40.0)

## 2020-09-24 LAB — HEMOGLOBIN A1C: Hgb A1c MFr Bld: 6 % (ref 4.6–6.5)

## 2020-09-25 ENCOUNTER — Encounter: Payer: Self-pay | Admitting: Family Medicine

## 2020-09-25 DIAGNOSIS — E782 Mixed hyperlipidemia: Secondary | ICD-10-CM

## 2020-09-29 NOTE — Telephone Encounter (Signed)
Please advise message below  °

## 2020-09-30 ENCOUNTER — Telehealth (HOSPITAL_BASED_OUTPATIENT_CLINIC_OR_DEPARTMENT_OTHER): Payer: Self-pay | Admitting: Obstetrics & Gynecology

## 2020-09-30 NOTE — Telephone Encounter (Signed)
Called and left message to please call the office back to schedule her appointment with Dr.Miller .

## 2020-10-12 ENCOUNTER — Other Ambulatory Visit (HOSPITAL_BASED_OUTPATIENT_CLINIC_OR_DEPARTMENT_OTHER): Payer: Self-pay

## 2020-10-12 MED ORDER — ATORVASTATIN CALCIUM 20 MG PO TABS
20.0000 mg | ORAL_TABLET | ORAL | 3 refills | Status: DC
  Filled 2020-10-12: qty 45, 90d supply, fill #0
  Filled 2021-01-19: qty 45, 90d supply, fill #1
  Filled 2021-04-20: qty 45, 90d supply, fill #2
  Filled 2021-07-20: qty 45, 90d supply, fill #3

## 2020-10-12 NOTE — Addendum Note (Signed)
Addended by: Haydee Salter on: 10/12/2020 08:34 AM   Modules accepted: Orders

## 2020-10-13 ENCOUNTER — Other Ambulatory Visit (HOSPITAL_BASED_OUTPATIENT_CLINIC_OR_DEPARTMENT_OTHER): Payer: Self-pay

## 2020-11-26 ENCOUNTER — Encounter: Payer: Self-pay | Admitting: Family Medicine

## 2020-11-27 NOTE — Telephone Encounter (Signed)
Please review patient message regarding last appt and advise.  Thanks.  Dm/cma

## 2020-12-23 ENCOUNTER — Ambulatory Visit (INDEPENDENT_AMBULATORY_CARE_PROVIDER_SITE_OTHER): Payer: 59 | Admitting: Family Medicine

## 2020-12-23 ENCOUNTER — Encounter: Payer: Self-pay | Admitting: Family Medicine

## 2020-12-23 ENCOUNTER — Other Ambulatory Visit: Payer: Self-pay

## 2020-12-23 VITALS — BP 124/82 | HR 76 | Temp 97.5°F | Ht 63.0 in | Wt 143.4 lb

## 2020-12-23 DIAGNOSIS — E782 Mixed hyperlipidemia: Secondary | ICD-10-CM

## 2020-12-23 DIAGNOSIS — Z0000 Encounter for general adult medical examination without abnormal findings: Secondary | ICD-10-CM

## 2020-12-23 DIAGNOSIS — I10 Essential (primary) hypertension: Secondary | ICD-10-CM | POA: Diagnosis not present

## 2020-12-23 LAB — LIPID PANEL
Cholesterol: 188 mg/dL (ref 0–200)
HDL: 60 mg/dL (ref >39.00)
LDL Cholesterol: 103 mg/dL — ABNORMAL HIGH (ref 0–99)
NonHDL: 127.94
Total CHOL/HDL Ratio: 3
Triglycerides: 124 mg/dL (ref 0.0–149.0)
VLDL: 24.8 mg/dL (ref 0.0–40.0)

## 2020-12-23 NOTE — Progress Notes (Signed)
[facility] [address] [place] [address] Dept: [phone] Dept Fax: [phone]  Annual Preventative Care Visit  Subjective:  Patient ID: Joan Morgan, female DOB: 12/17/1960, 60 y.o.. MRN: [id number]      Chief Complaint  Patient presents with  . Annual Exam    CP/labs. Fasting today. No concerns.    History of Present Illness:  Patient is in today for an annual preventative care appointment. Joan Morgan was last seen on 09/23/2020 to establish care. She feels her health has been good overall since that visit. We did identify that she continued to have significant hyperlipidemia. and restarted her on atorvastatin. She is taking this without problems.  Past Medical History:      Patient Active Problem List   Diagnosis Date Noted  . Prediabetes 12/31/2019  . Chronic pain of both knees 06/20/2019  . Neck stiffness 12/06/2017  . Constipation 08/16/2017  . Hyperlipidemia 10/14/2014  . Essential hypertension         Past Surgical History:  Procedure Laterality Date  . CYSTOCELE REPAIR N/A 01/21/2020   Procedure: REAPIR ANTERIOR REPAIR (CYSTOCELE) CYSTOSCOPY; Surgeon: Bjorn Loser, MD; Location: [facility]; Service: Urology; Laterality: N/A;  . TOTAL LAPAROSCOPIC HYSTERECTOMY WITH BILATERAL SALPINGO OOPHORECTOMY Bilateral 01/21/2020   Procedure: TOTAL LAPAROSCOPIC HYSTERECTOMY WITH BILATERAL SALPINGO OOPHORECTOMY; Surgeon: Megan Salon, MD; Location: [facility]; Service: Gynecology; Laterality: Bilateral;  . TUBAL LIGATION    . VAGINAL PROLAPSE REPAIR N/A 01/21/2020   Procedure: REPAIR VAGINAL VAULT SUSPENSION AND GRIFT; Surgeon: Bjorn Loser, MD; Location: [facility]; Service: Urology; Laterality: N/A;        Family History  Problem Relation Age of Onset  . Hyperlipidemia Mother   . Hypertension Mother   . Aneurysm Mother   . Hyperlipidemia Father   . Breast cancer Neg Hx          Outpatient Medications  Prior to Visit  Medication Sig Dispense Refill  . amLODipine (NORVASC) 5 MG tablet TAKE 1 TABLET BY MOUTH DAILY. 90 tablet 3  . atorvastatin (LIPITOR) 20 MG tablet Take 1 tablet (20 mg total) by mouth every other day. 45 tablet 3  . hydrochlorothiazide (HYDRODIURIL) 12.5 MG tablet TAKE 1 TABLET BY MOUTH DAILY EVERY MORNING 90 tablet 3   No facility-administered medications prior to visit.        Allergies  Allergen Reactions  . Ace Inhibitors     cough   Review of Systems  Constitutional: Negative for chills, diaphoresis, fever, malaise/fatigue and weight loss.  HENT: Positive for sore throat. Negative for congestion, ear discharge, ear pain, hearing loss, nosebleeds, sinus pain and tinnitus.  Occasional sore throat associated with eating spicy foods.  Eyes: Positive for redness. Negative for blurred vision, double vision, photophobia, pain and discharge.  Occasional dry eyes. Uses OTC moistening drops.  Respiratory: Negative for cough, hemoptysis, sputum production, shortness of breath, wheezing and stridor.  Cardiovascular: Negative for chest pain, palpitations, orthopnea, claudication, leg swelling and PND.  Gastrointestinal: Positive for constipation and heartburn. Negative for abdominal pain, blood in stool, diarrhea, melena, nausea and vomiting.  Occasional symptoms of heartburn, esp. with spicy foods. Occasional constipation, managed with increased dietary fiber.  Genitourinary: Positive for dysuria. Negative for flank pain, frequency, hematuria and urgency.  Occasional dysuria. Thinks this may be due to concentrated urine/  Musculoskeletal: Positive for joint pain, myalgias and neck pain. Negative for back pain and falls.  Occasional right calf pain. No inciting issues. Occasional stiffness in neck.  Bilateral knee pain at times.  Skin: Negative for itching and rash.  Neurological: Positive for tingling and headaches. Negative for dizziness, tremors, sensory change, speech change,  focal weakness, seizures, loss of consciousness and weakness.  Occasional headaches, responsive to Tylenol. Occasional tingling in right foot.  Endo/Heme/Allergies: Positive for environmental allergies. Negative for polydipsia. Does not bruise/bleed easily.  Seasonal allergies.  Psychiatric/Behavioral: Negative for depression, hallucinations, memory loss, substance abuse and suicidal ideas. The patient is not nervous/anxious and does not have insomnia.  Objective:      Today's Vitals   12/23/20 1008  BP: 124/82  Pulse: 76  Temp: (!) 97.5 F (36.4 C)  TempSrc: Temporal  SpO2: 99%  Weight: 143 lb 6.4 oz (65 kg)  Height: 5\' 3"  (1.6 m)   Body mass index is 25.4 kg/m.  General: Well developed, well nourished. No acute distress.  HEENT: Normocephalic, non-traumatic. PERRL, EOMI. Conjunctiva clear. Fundiscopic exam shows normal disc and  vasculature. External ears normal. EAC and TMs normal bilaterally. Nose clear without congestion or rhinorrhea. Mucous  membranes moist. Oropharynx clear. Good dentition.  Neck: Supple. No lymphadenopathy. No thyromegaly.  Lungs: Clear to auscultation bilaterally. No wheezing, rales or rhonchi.  CV: RRR without murmurs or rubs. Pulses 2+ bilaterally.  Abdomen: Soft, non-tender. Bowel sounds positive, normal pitch and frequency. No hepatosplenomegaly. No rebound or  guarding.  Back: Straight. No CVA tenderness bilaterally.  Extremities: Full ROM. No joint swelling or tenderness. No edema noted.  Skin: Warm and dry. No rashes.  Psych: Alert and oriented. Normal mood and affect.  There are no preventive care reminders to display for this patient.  Lab Results  Recent Labs       Lab Results  Component Value Date   CHOL 252 (H) 09/24/2020   HDL 56.40 09/24/2020   LDLCALC 171 (H) 09/24/2020   LDLDIRECT 159.7 10/24/2012   TRIG 122.0 09/24/2020   CHOLHDL 4 09/24/2020   Recent Labs       Lab Results  Component Value Date   TSH 1.63 09/24/2020    Recent Labs       Lab Results  Component Value Date   HGBA1C 6.0 09/24/2020   Assessment & Plan:  1. Encounter for preventative adult health care examination  Overall good health. No health maintenance gaps noted. Routine guidance regarding exercise and diet discussed. [name] requests to do mammography every 2 years.  2. Mixed hyperlipidemia  Due for repeat lipids today.  - Lipid panel  3. Essential hypertension  Blood pressure is at goal. Continue amlodipine and HCTZ.   Haydee Salter, MD

## 2021-01-05 ENCOUNTER — Other Ambulatory Visit (HOSPITAL_BASED_OUTPATIENT_CLINIC_OR_DEPARTMENT_OTHER): Payer: Self-pay

## 2021-01-05 MED FILL — Hydrochlorothiazide Tab 12.5 MG: ORAL | 90 days supply | Qty: 90 | Fill #0 | Status: AC

## 2021-01-05 MED FILL — Amlodipine Besylate Tab 5 MG (Base Equivalent): ORAL | 90 days supply | Qty: 90 | Fill #0 | Status: AC

## 2021-01-06 ENCOUNTER — Ambulatory Visit: Payer: 59 | Admitting: Family Medicine

## 2021-01-20 ENCOUNTER — Other Ambulatory Visit (HOSPITAL_BASED_OUTPATIENT_CLINIC_OR_DEPARTMENT_OTHER): Payer: Self-pay

## 2021-02-10 ENCOUNTER — Ambulatory Visit: Payer: 59 | Attending: Internal Medicine

## 2021-02-10 DIAGNOSIS — Z23 Encounter for immunization: Secondary | ICD-10-CM

## 2021-02-10 NOTE — Progress Notes (Signed)
   Covid-19 Vaccination Clinic  Name:  Loveah Like    MRN: [id number] DOB: 04/09/1961  02/10/2021  Ms. Hitt was observed post Covid-19 immunization for 15 minutes without incident. She was provided with Vaccine Information Sheet and instruction to access the V-Safe system.   Ms. Cashatt was instructed to call 911 with any severe reactions post vaccine: Difficulty breathing  Swelling of face and throat  A fast heartbeat  A bad rash all over body  Dizziness and weakness   Immunizations Administered     Name Date Dose VIS Date Route   Moderna Covid-19 Booster Vaccine 02/10/2021  9:37 AM 0.25 mL 05/20/2020 Intramuscular   Manufacturer: Moderna   Lot: [id number]   [place]: [id number]

## 2021-02-12 ENCOUNTER — Other Ambulatory Visit (HOSPITAL_BASED_OUTPATIENT_CLINIC_OR_DEPARTMENT_OTHER): Payer: Self-pay

## 2021-02-12 MED ORDER — COVID-19 MRNA VACC (MODERNA) 100 MCG/0.5ML IM SUSP
INTRAMUSCULAR | 0 refills | Status: DC
  Filled 2021-02-12: qty 0.25, 1d supply, fill #0

## 2021-03-03 ENCOUNTER — Other Ambulatory Visit (HOSPITAL_BASED_OUTPATIENT_CLINIC_OR_DEPARTMENT_OTHER): Payer: Self-pay

## 2021-03-03 MED ORDER — OMRON 3 SERIES BP MONITOR DEVI
0 refills | Status: DC
  Filled 2021-03-03: qty 1, 30d supply, fill #0

## 2021-03-04 ENCOUNTER — Other Ambulatory Visit (HOSPITAL_BASED_OUTPATIENT_CLINIC_OR_DEPARTMENT_OTHER): Payer: Self-pay

## 2021-03-28 IMAGING — MG DIGITAL SCREENING BILAT W/ TOMO W/ CAD
6 of 10 series · 6 of 30 positions shown · non-contrast
Comparison: Previous exam(s).

CLINICAL DATA: Screening.

EXAM:
DIGITAL SCREENING BILATERAL MAMMOGRAM WITH TOMO AND CAD

[R CC synth-2D]
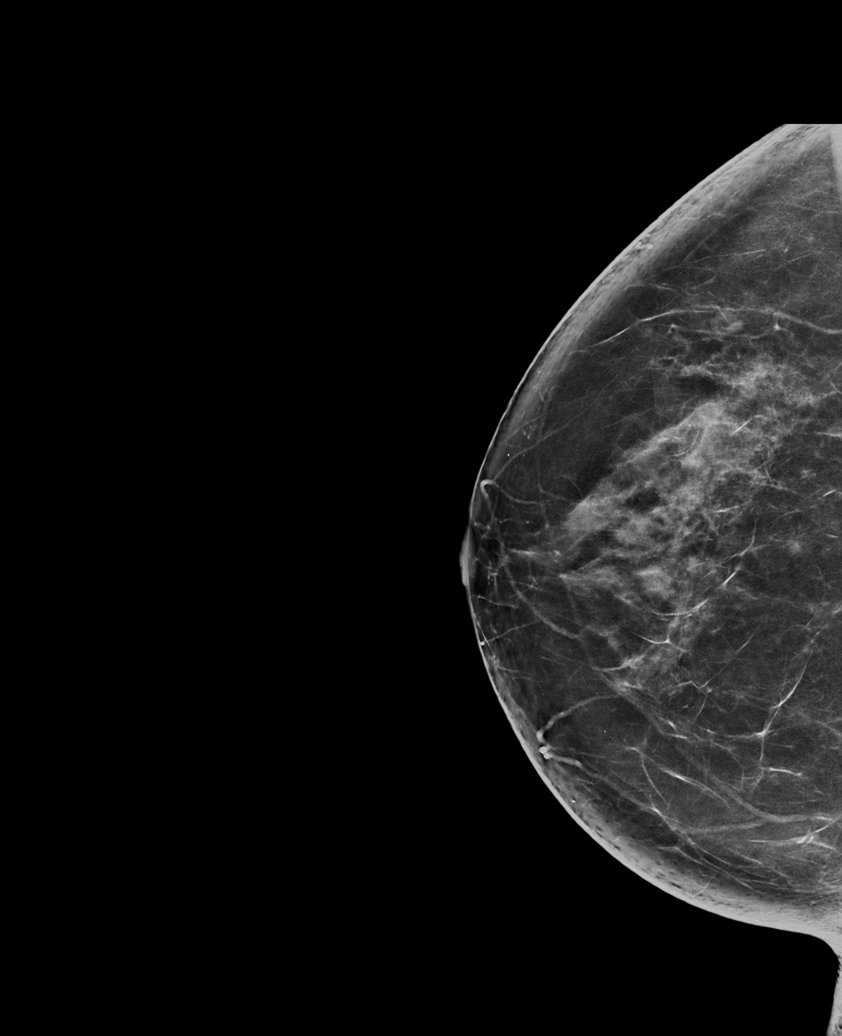

[L CC synth-2D]
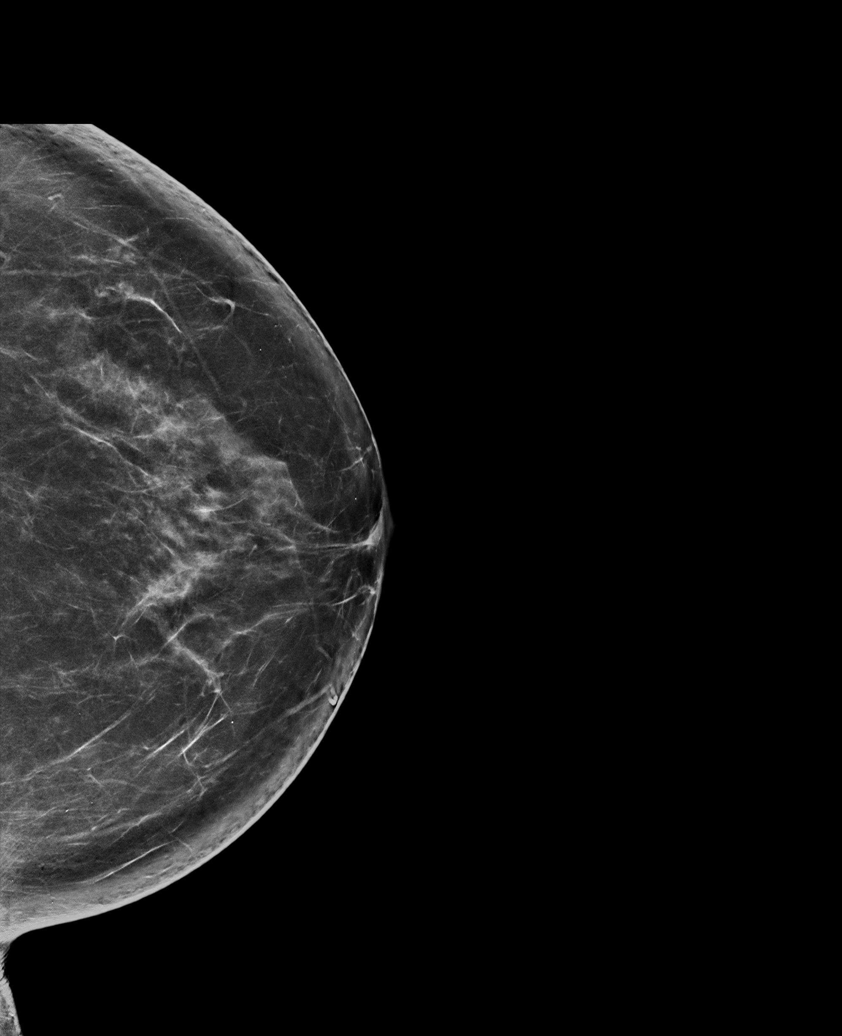

[L MLO synth-2D (1 of 2)]
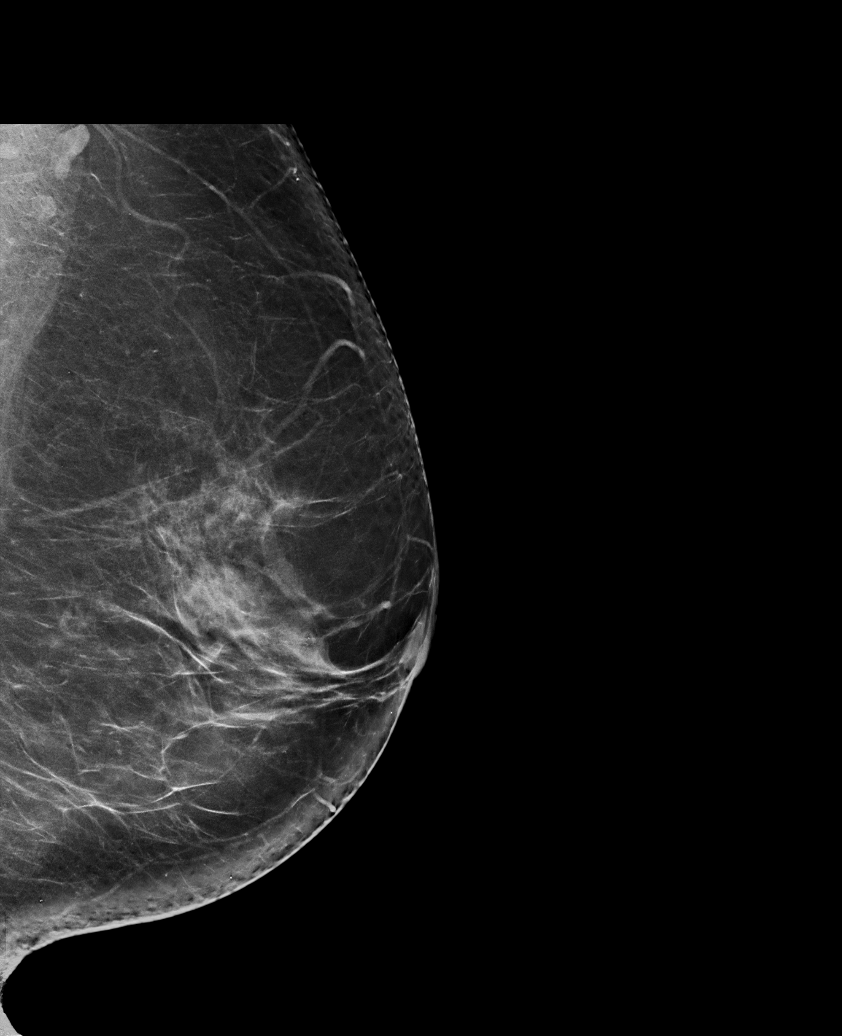

[L MLO synth-2D (2 of 2)]
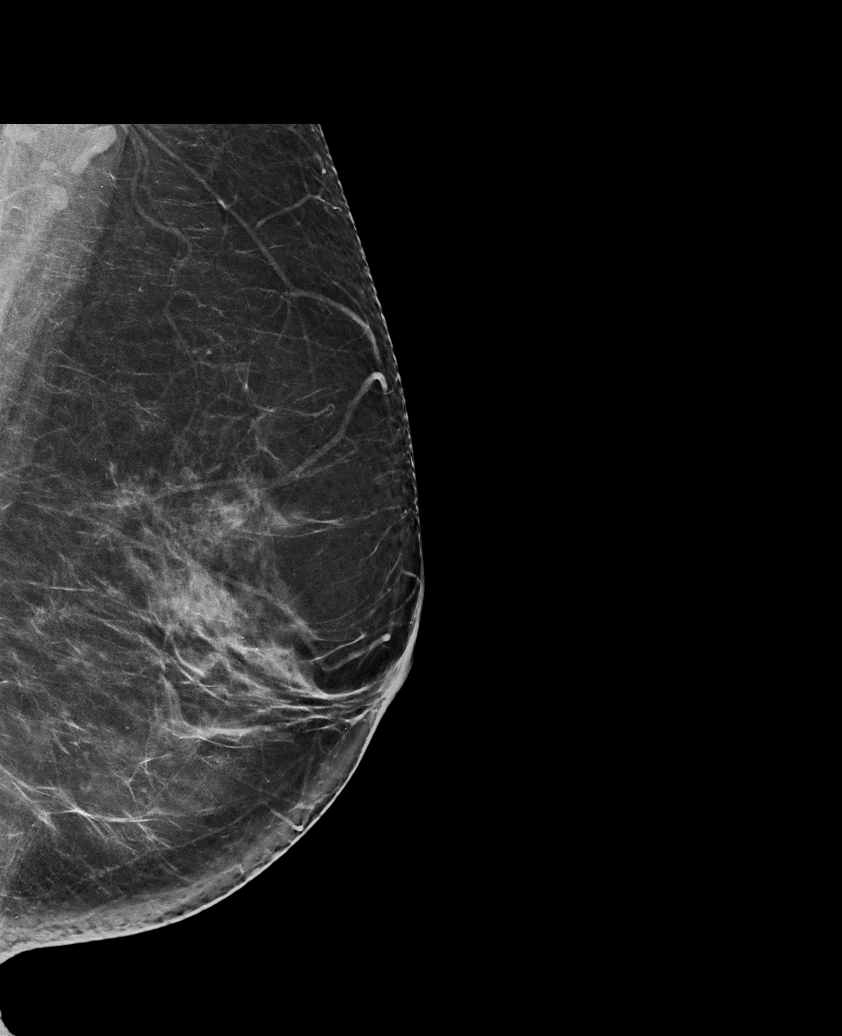

[R MLO synth-2D]
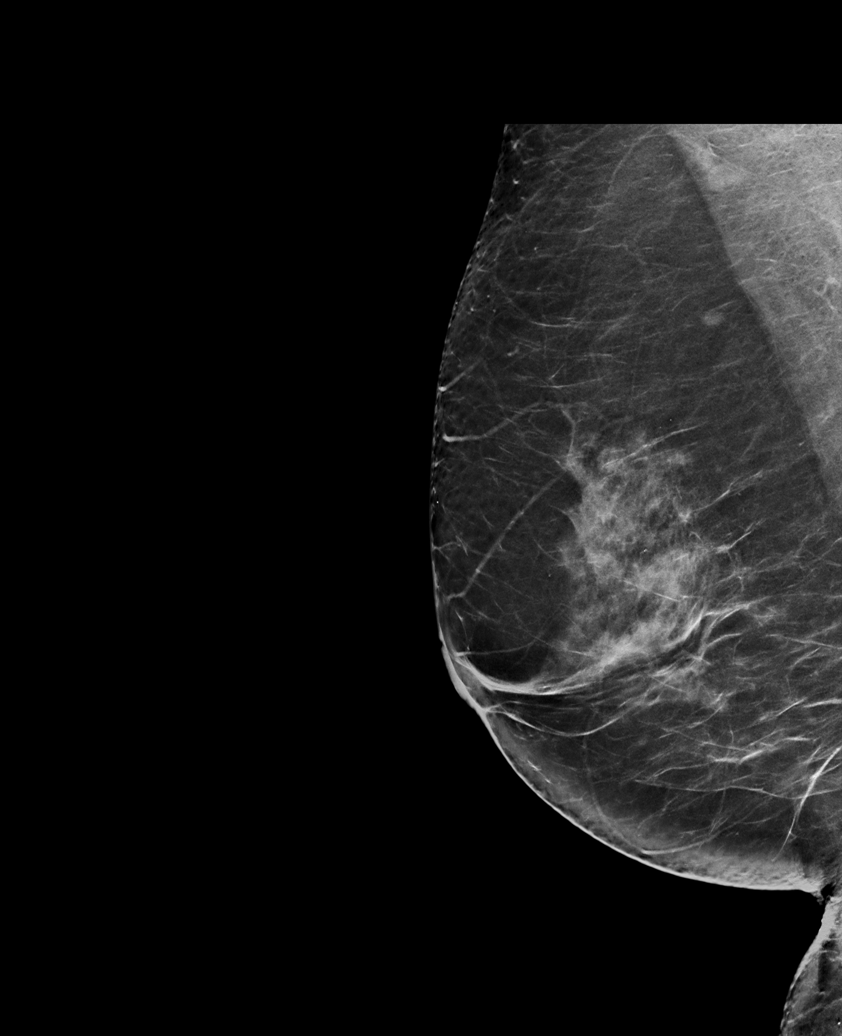

[R CC tomo · tomo slice 42/83.0]
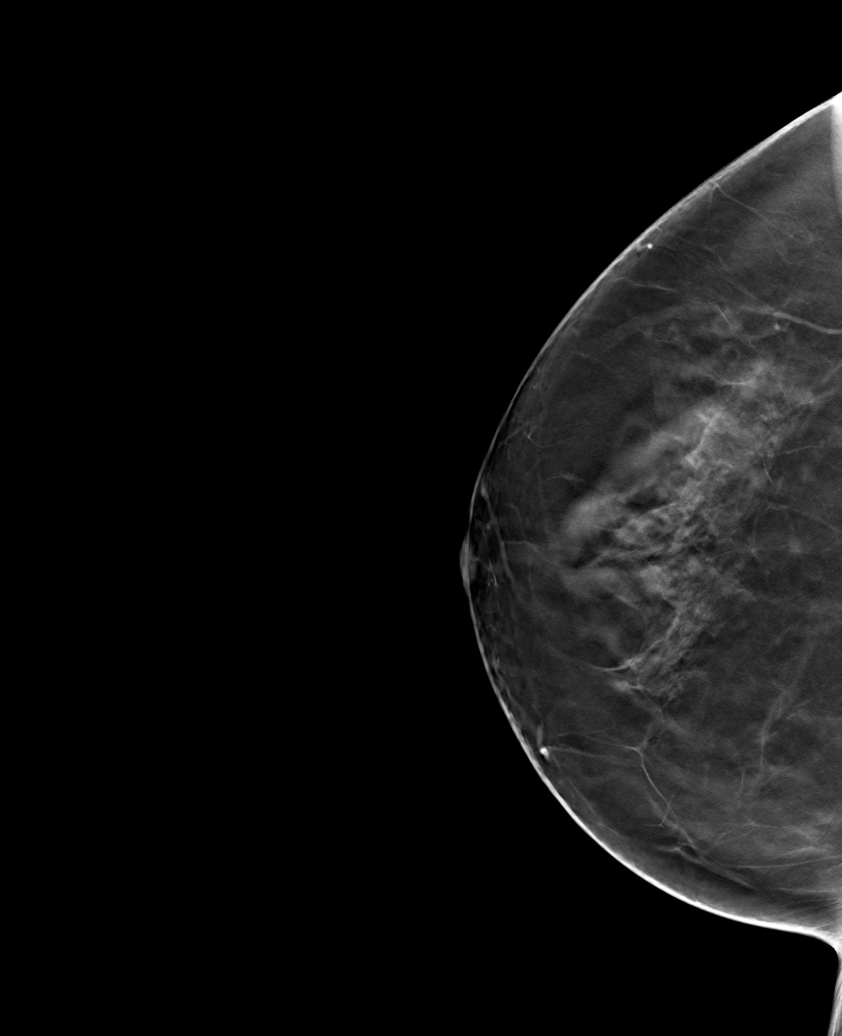

[6 of 30 positions shown; findings below may reference images not displayed]

ACR Breast Density Category b: There are scattered areas of
fibroglandular density.
FINDINGS: There are no findings suspicious for malignancy. Images were
processed with CAD.
IMPRESSION: No mammographic evidence of malignancy. A result letter of this
screening mammogram will be mailed directly to the patient.

RECOMMENDATION:
Screening mammogram in one year. (Code:[id number])

BI-RADS CATEGORY  1: Negative.

## 2021-04-04 MED FILL — Amlodipine Besylate Tab 5 MG (Base Equivalent): ORAL | 90 days supply | Qty: 90 | Fill #1 | Status: AC

## 2021-04-04 MED FILL — Hydrochlorothiazide Tab 12.5 MG: ORAL | 90 days supply | Qty: 90 | Fill #1 | Status: AC

## 2021-04-06 ENCOUNTER — Other Ambulatory Visit (HOSPITAL_BASED_OUTPATIENT_CLINIC_OR_DEPARTMENT_OTHER): Payer: Self-pay

## 2021-04-14 ENCOUNTER — Telehealth: Payer: Self-pay | Admitting: Family Medicine

## 2021-04-14 NOTE — Telephone Encounter (Signed)
Immunization record printed and patient notified that it is up front for pick up VIA phone. Dm/cma

## 2021-04-14 NOTE — Telephone Encounter (Signed)
Pt is wanting  a copy of her tdap shot from 2019. Whenever this is ready she would like a call at [phone] and she will come by and pick up.

## 2021-04-20 ENCOUNTER — Other Ambulatory Visit (HOSPITAL_BASED_OUTPATIENT_CLINIC_OR_DEPARTMENT_OTHER): Payer: Self-pay

## 2021-05-14 ENCOUNTER — Other Ambulatory Visit (HOSPITAL_BASED_OUTPATIENT_CLINIC_OR_DEPARTMENT_OTHER): Payer: Self-pay

## 2021-05-14 MED ORDER — INFLUENZA VAC SPLIT QUAD 0.5 ML IM SUSY
PREFILLED_SYRINGE | INTRAMUSCULAR | 0 refills | Status: DC
  Filled 2021-05-14: qty 0.5, 1d supply, fill #0

## 2021-06-03 ENCOUNTER — Telehealth (INDEPENDENT_AMBULATORY_CARE_PROVIDER_SITE_OTHER): Payer: 59 | Admitting: Family Medicine

## 2021-06-03 ENCOUNTER — Ambulatory Visit: Payer: 59 | Admitting: Family Medicine

## 2021-06-03 ENCOUNTER — Other Ambulatory Visit (HOSPITAL_BASED_OUTPATIENT_CLINIC_OR_DEPARTMENT_OTHER): Payer: Self-pay

## 2021-06-03 VITALS — Temp 98.4°F | Wt 150.0 lb

## 2021-06-03 DIAGNOSIS — I10 Essential (primary) hypertension: Secondary | ICD-10-CM | POA: Diagnosis not present

## 2021-06-03 DIAGNOSIS — E782 Mixed hyperlipidemia: Secondary | ICD-10-CM

## 2021-06-03 MED ORDER — AMLODIPINE BESYLATE-VALSARTAN 5-160 MG PO TABS
1.0000 | ORAL_TABLET | Freq: Every day | ORAL | 3 refills | Status: DC
  Filled 2021-06-03: qty 90, 90d supply, fill #0
  Filled 2021-07-07: qty 30, 30d supply, fill #1

## 2021-06-03 NOTE — Patient Instructions (Signed)
Recommend you consider getting the updated (bivalent) booster for COVID

## 2021-06-03 NOTE — Progress Notes (Addendum)
[facility] [address] [place] [address] Dept: [phone] Dept Fax: [phone]  Virtual Video Visit  I connected with Joan Morgan on 06/03/21 at  9:30 AM EDT by a video enabled telemedicine application and verified that I am speaking with the correct person using two identifiers.  Location patient: Home Location provider: Home Office Persons participating in the virtual visit: Patient, Provider  I discussed the limitations of evaluation and management by telemedicine and the availability of in person appointments. The patient expressed understanding and agreed to proceed.  Chief Complaint  Patient presents with   Follow-up    C/o having low potassium level(checked at work), has stopped taking HCTZ 05/03/21.  BP is about 138/87.      SUBJECTIVE:  HPI: Joan Morgan is a 60 y.o. female who presents for management of her chronic medical issues.  Joan Morgan has a history of hypertension managed with amlodipine and HCTZ. However, she had been noticing cramping in her feet and ankles. 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 She attributed this low value to her HCTZ., which she stopped about a month ago. She has subsequently had the cramping resolve, but notes her blood pressures are averaging 138/78 (higher than previously).   Joan Morgan has a history of hyperlipidemia. She is on atorvastatin.   Patient Active Problem List   Diagnosis Date Noted   Prediabetes 12/31/2019   Chronic pain of both knees 06/20/2019   Neck stiffness 12/06/2017   Constipation 08/16/2017   Hyperlipidemia 10/14/2014   Essential hypertension    Past Surgical History:  Procedure Laterality Date   CYSTOCELE REPAIR N/A 01/21/2020   Procedure: REAPIR ANTERIOR REPAIR (CYSTOCELE) CYSTOSCOPY;  Surgeon: Bjorn Loser, MD;  Location: [facility];  Service: Urology;  Laterality: N/A;   TOTAL LAPAROSCOPIC  HYSTERECTOMY WITH BILATERAL SALPINGO OOPHORECTOMY Bilateral 01/21/2020   Procedure: TOTAL LAPAROSCOPIC HYSTERECTOMY WITH BILATERAL SALPINGO OOPHORECTOMY;  Surgeon: Megan Salon, MD;  Location: [facility];  Service: Gynecology;  Laterality: Bilateral;   TUBAL LIGATION     VAGINAL PROLAPSE REPAIR N/A 01/21/2020   Procedure: REPAIR VAGINAL VAULT SUSPENSION AND GRIFT;  Surgeon: Bjorn Loser, MD;  Location: [facility];  Service: Urology;  Laterality: N/A;   Family History  Problem Relation Age of Onset   Hyperlipidemia Mother    Hypertension Mother    Aneurysm Mother    Hyperlipidemia Father    Breast cancer Neg Hx    Social History   Tobacco Use   Smoking status: Never   Smokeless tobacco: Never  Vaping Use   Vaping Use: Never used  Substance Use Topics   Alcohol use: No   Drug use: No    Current Outpatient Medications:    amLODipine-valsartan (EXFORGE) 5-160 MG tablet, Take 1 tablet by mouth daily., Disp: 30 tablet, Rfl: 3   atorvastatin (LIPITOR) 20 MG tablet, Take 1 tablet (20 mg total) by mouth every other day., Disp: 45 tablet, Rfl: 3   Blood Pressure Monitoring (OMRON 3 SERIES BP MONITOR) DEVI, Use as directed, Disp: 1 each, Rfl: 0  Allergies  Allergen Reactions   Ace Inhibitors     cough   ROS: See pertinent positives and negatives per HPI.  OBSERVATIONS/OBJECTIVE:  VITALS per patient if applicable: Today's Vitals   06/03/21 0912  Temp: 98.4 F (36.9 C)  TempSrc: Temporal  Weight: 150 lb (68 kg)   Body mass index is 26.57 kg/m.   GENERAL: Alert and  oriented. Appears well and in no acute distress.  PSYCH/NEURO: Pleasant and cooperative. No obvious depression or anxiety. Speech and thought processing grossly intact.  ASSESSMENT AND PLAN:  1. Essential hypertension It is reasonable that the HCTZ could have contributed to mild hypokalemia, which in turn, could have caused her cramping. I do think it reasonable to recheck her potassium level to see if this remains  low, now off of the diuretic. We discussed the option to start a combination pill for there blood pressure. I will start her on Exforge, which will add an ARB to her regimen. We will recheck on her blood pressure in 3 months.  - amLODipine-valsartan (EXFORGE) 5-160 MG tablet; Take 1 tablet by mouth daily.  Dispense: 30 tablet; Refill: 3 - Basic metabolic panel; Future  2. Mixed hyperlipidemia I will have Joan Morgan return for fasting lipids to assess the adequacy of her statin therapy. She has been reluctant to increase ehr statin due to concersn over cramping.  - Lipid panel; Future  I discussed the assessment and treatment plan with the patient. The patient was provided an opportunity to ask questions and all were answered. The patient agreed with the plan and demonstrated an understanding of the instructions.  Haydee Salter, MD

## 2021-06-04 ENCOUNTER — Other Ambulatory Visit (INDEPENDENT_AMBULATORY_CARE_PROVIDER_SITE_OTHER): Payer: 59

## 2021-06-04 ENCOUNTER — Other Ambulatory Visit (HOSPITAL_BASED_OUTPATIENT_CLINIC_OR_DEPARTMENT_OTHER): Payer: Self-pay

## 2021-06-04 ENCOUNTER — Other Ambulatory Visit: Payer: Self-pay

## 2021-06-04 DIAGNOSIS — I10 Essential (primary) hypertension: Secondary | ICD-10-CM | POA: Diagnosis not present

## 2021-06-04 DIAGNOSIS — E782 Mixed hyperlipidemia: Secondary | ICD-10-CM | POA: Diagnosis not present

## 2021-06-04 LAB — LIPID PANEL
Cholesterol: 183 mg/dL (ref 0–200)
HDL: 60.6 mg/dL (ref >39.00)
LDL Cholesterol: 104 mg/dL — ABNORMAL HIGH (ref 0–99)
NonHDL: 122.09
Total CHOL/HDL Ratio: 3
Triglycerides: 89 mg/dL (ref 0.0–149.0)
VLDL: 17.8 mg/dL (ref 0.0–40.0)

## 2021-06-04 LAB — BASIC METABOLIC PANEL
BUN: 13 mg/dL (ref 6–23)
CO2: 30 mEq/L (ref 19–32)
Calcium: 9.5 mg/dL (ref 8.4–10.5)
Chloride: 107 mEq/L (ref 96–112)
Creatinine, Ser: 0.78 mg/dL (ref 0.40–1.20)
GFR: 81.77 mL/min (ref >60.00)
Glucose, Bld: 107 mg/dL — ABNORMAL HIGH (ref 70–99)
Potassium: 3.8 mEq/L (ref 3.5–5.1)
Sodium: 143 mEq/L (ref 135–145)

## 2021-06-04 NOTE — Progress Notes (Signed)
Per the orders of dr. Gena Fray pt is here for labs, pt tolerated draw well. Pt was able to provide adequate amount for urine sample requested.

## 2021-06-30 ENCOUNTER — Ambulatory Visit: Payer: 59 | Admitting: Family Medicine

## 2021-07-06 ENCOUNTER — Other Ambulatory Visit (HOSPITAL_BASED_OUTPATIENT_CLINIC_OR_DEPARTMENT_OTHER): Payer: Self-pay

## 2021-07-07 ENCOUNTER — Other Ambulatory Visit (HOSPITAL_BASED_OUTPATIENT_CLINIC_OR_DEPARTMENT_OTHER): Payer: Self-pay

## 2021-07-07 ENCOUNTER — Other Ambulatory Visit: Payer: Self-pay | Admitting: Family Medicine

## 2021-07-07 DIAGNOSIS — I10 Essential (primary) hypertension: Secondary | ICD-10-CM

## 2021-07-07 MED ORDER — AMLODIPINE BESYLATE-VALSARTAN 5-160 MG PO TABS
1.0000 | ORAL_TABLET | Freq: Every day | ORAL | 0 refills | Status: DC
  Filled 2021-08-09: qty 90, 90d supply, fill #0

## 2021-07-21 ENCOUNTER — Other Ambulatory Visit (HOSPITAL_BASED_OUTPATIENT_CLINIC_OR_DEPARTMENT_OTHER): Payer: Self-pay

## 2021-08-09 ENCOUNTER — Other Ambulatory Visit (HOSPITAL_BASED_OUTPATIENT_CLINIC_OR_DEPARTMENT_OTHER): Payer: Self-pay

## 2021-08-09 ENCOUNTER — Encounter: Payer: Self-pay | Admitting: Family Medicine

## 2021-08-09 DIAGNOSIS — I10 Essential (primary) hypertension: Secondary | ICD-10-CM

## 2021-08-09 MED ORDER — AMLODIPINE BESYLATE-VALSARTAN 5-160 MG PO TABS
1.0000 | ORAL_TABLET | Freq: Every day | ORAL | 3 refills | Status: DC
  Filled 2021-08-09: qty 90, 90d supply, fill #0

## 2021-09-20 ENCOUNTER — Other Ambulatory Visit (HOSPITAL_BASED_OUTPATIENT_CLINIC_OR_DEPARTMENT_OTHER): Payer: Self-pay

## 2021-10-15 ENCOUNTER — Other Ambulatory Visit (HOSPITAL_BASED_OUTPATIENT_CLINIC_OR_DEPARTMENT_OTHER): Payer: Self-pay

## 2021-10-23 ENCOUNTER — Other Ambulatory Visit: Payer: Self-pay | Admitting: Family Medicine

## 2021-10-23 DIAGNOSIS — E782 Mixed hyperlipidemia: Secondary | ICD-10-CM

## 2021-10-25 ENCOUNTER — Other Ambulatory Visit (HOSPITAL_BASED_OUTPATIENT_CLINIC_OR_DEPARTMENT_OTHER): Payer: Self-pay

## 2021-10-25 MED ORDER — ATORVASTATIN CALCIUM 20 MG PO TABS
20.0000 mg | ORAL_TABLET | ORAL | 3 refills | Status: DC
  Filled 2021-10-25: qty 45, 90d supply, fill #0
  Filled 2022-01-18: qty 45, 90d supply, fill #1
  Filled 2022-04-19: qty 45, 90d supply, fill #2
  Filled 2022-07-20: qty 45, 90d supply, fill #3

## 2021-10-28 ENCOUNTER — Other Ambulatory Visit (HOSPITAL_BASED_OUTPATIENT_CLINIC_OR_DEPARTMENT_OTHER): Payer: Self-pay

## 2021-11-01 ENCOUNTER — Other Ambulatory Visit (HOSPITAL_BASED_OUTPATIENT_CLINIC_OR_DEPARTMENT_OTHER): Payer: Self-pay

## 2021-11-14 ENCOUNTER — Other Ambulatory Visit: Payer: Self-pay | Admitting: Family Medicine

## 2021-11-14 DIAGNOSIS — I10 Essential (primary) hypertension: Secondary | ICD-10-CM

## 2021-11-15 ENCOUNTER — Other Ambulatory Visit (HOSPITAL_BASED_OUTPATIENT_CLINIC_OR_DEPARTMENT_OTHER): Payer: Self-pay

## 2021-11-15 MED ORDER — AMLODIPINE BESYLATE-VALSARTAN 5-160 MG PO TABS
1.0000 | ORAL_TABLET | Freq: Every day | ORAL | 0 refills | Status: DC
  Filled 2021-11-15: qty 90, 90d supply, fill #0

## 2021-11-16 ENCOUNTER — Other Ambulatory Visit (HOSPITAL_BASED_OUTPATIENT_CLINIC_OR_DEPARTMENT_OTHER): Payer: Self-pay

## 2021-12-29 ENCOUNTER — Encounter: Payer: Self-pay | Admitting: Family Medicine

## 2021-12-29 ENCOUNTER — Ambulatory Visit (INDEPENDENT_AMBULATORY_CARE_PROVIDER_SITE_OTHER): Payer: 59 | Admitting: Family Medicine

## 2021-12-29 VITALS — BP 126/80 | HR 67 | Temp 97.1°F | Ht 63.0 in | Wt 143.0 lb

## 2021-12-29 DIAGNOSIS — F4321 Adjustment disorder with depressed mood: Secondary | ICD-10-CM

## 2021-12-29 DIAGNOSIS — E782 Mixed hyperlipidemia: Secondary | ICD-10-CM | POA: Diagnosis not present

## 2021-12-29 DIAGNOSIS — I10 Essential (primary) hypertension: Secondary | ICD-10-CM

## 2021-12-29 DIAGNOSIS — Z1211 Encounter for screening for malignant neoplasm of colon: Secondary | ICD-10-CM | POA: Diagnosis not present

## 2021-12-29 DIAGNOSIS — J301 Allergic rhinitis due to pollen: Secondary | ICD-10-CM

## 2021-12-29 DIAGNOSIS — Z0000 Encounter for general adult medical examination without abnormal findings: Secondary | ICD-10-CM

## 2021-12-29 DIAGNOSIS — H25013 Cortical age-related cataract, bilateral: Secondary | ICD-10-CM | POA: Insufficient documentation

## 2021-12-29 DIAGNOSIS — R0989 Other specified symptoms and signs involving the circulatory and respiratory systems: Secondary | ICD-10-CM | POA: Diagnosis not present

## 2021-12-29 DIAGNOSIS — R7303 Prediabetes: Secondary | ICD-10-CM | POA: Diagnosis not present

## 2021-12-29 LAB — COMPREHENSIVE METABOLIC PANEL
ALT: 20 U/L (ref 0–35)
AST: 20 U/L (ref 0–37)
Albumin: 4.7 g/dL (ref 3.5–5.2)
Alkaline Phosphatase: 77 U/L (ref 39–117)
BUN: 12 mg/dL (ref 6–23)
CO2: 29 mEq/L (ref 19–32)
Calcium: 10.6 mg/dL — ABNORMAL HIGH (ref 8.4–10.5)
Chloride: 100 mEq/L (ref 96–112)
Creatinine, Ser: 0.78 mg/dL (ref 0.40–1.20)
GFR: 81.45 mL/min (ref >60.00)
Glucose, Bld: 108 mg/dL — ABNORMAL HIGH (ref 70–99)
Potassium: 3.6 mEq/L (ref 3.5–5.1)
Sodium: 139 mEq/L (ref 135–145)
Total Bilirubin: 0.8 mg/dL (ref 0.2–1.2)
Total Protein: 8.1 g/dL (ref 6.0–8.3)

## 2021-12-29 LAB — HEMOGLOBIN A1C: Hgb A1c MFr Bld: 6.2 % (ref 4.6–6.5)

## 2021-12-29 LAB — LIPID PANEL
Cholesterol: 183 mg/dL (ref 0–200)
HDL: 63.3 mg/dL (ref >39.00)
LDL Cholesterol: 100 mg/dL — ABNORMAL HIGH (ref 0–99)
NonHDL: 119.28
Total CHOL/HDL Ratio: 3
Triglycerides: 98 mg/dL (ref 0.0–149.0)
VLDL: 19.6 mg/dL (ref 0.0–40.0)

## 2021-12-29 LAB — TSH: TSH: 1.48 u[IU]/mL (ref 0.35–5.50)

## 2021-12-29 NOTE — Progress Notes (Signed)
[facility] [address] [place] [address] Dept: [phone] Dept Fax: [phone]  Annual Physical Visit  Subjective:    Patient ID: Joan Morgan, female    DOB: 01-11-1961, 61 y.o..   MRN: [id number]  Chief Complaint  Patient presents with   Annual Exam    CPE/labs.  Fasting today.  Wants referral to vein/vascular.     History of Present Illness:  Patient is in today for an annual physical/preventative visit.  Ms. Muramoto shares that her husband of 70 years died on 2022-12-17 form progressive Parkinson's disease. His funeral was held and her two sons were in town, but have returned home. She started back to work this past week and feels she is doing okay.  Ms. [place] has hypertension. She is managed on amlodipine-valsartan (Exforge) 5-160 mg daily. She notes that int he past 3 weeks, her blood pressure has been a bit higher. She describes ab episode where her blood pressure went up at home to 170/110, She called EMS, but by the time they arrived, it was back down. She had some additional amlodipine tablets at home, so had taken an extra pill each day. Prior to her husband's death, her BP had been doing fine.  Ms. Pallo has hyperlipidemia. She is managed on atorvastatin every other day.  Review of Systems  Constitutional:  Negative for chills, fever, malaise/fatigue and weight loss.  HENT:  Positive for congestion. Negative for ear discharge, ear pain, hearing loss, sinus pain and sore throat.        Since April has noted mild nasal symptoms.  Eyes:  Negative for blurred vision, pain, discharge and redness.  Respiratory:  Positive for cough. Negative for sputum production, shortness of breath and wheezing.        Since April, has had mild cough.  Cardiovascular:  Negative for chest pain, palpitations, claudication and leg swelling.       Notes hands and feet are often cool to the touch and sweaty. She  has concern over peripheral circulation. She has a pamphlet from [employer] regarding screenings they offer for carotid artery disease, PAD, AAA, and a. fib. She wonders if she needs these.  Gastrointestinal:  Positive for heartburn. Negative for abdominal pain, constipation, diarrhea, nausea and vomiting.       Has episodic heartburn when she eats spicy foods. She states she knows how to avoid this and manage her symptoms.  Musculoskeletal:  Positive for joint pain. Negative for back pain, myalgias and neck pain.       Mild right wrist pain at times. She was uncertain if this might be carpal tunnel.  Skin:  Negative for itching and rash.  Neurological:  Negative for tingling and sensory change.   Past Medical History: Patient Active Problem List   Diagnosis Date Noted   Cataract cortical, senile, bilateral 12/29/2021   Prediabetes 12/31/2019   Chronic pain of both knees 06/20/2019   Constipation 08/16/2017   Hyperlipidemia 10/14/2014   Essential hypertension    Past Surgical History:  Procedure Laterality Date   CYSTOCELE REPAIR N/A 01/21/2020   Procedure: REAPIR ANTERIOR REPAIR (CYSTOCELE) CYSTOSCOPY;  Surgeon: Bjorn Loser, MD;  Location: [facility];  Service: Urology;  Laterality: N/A;   TOTAL LAPAROSCOPIC HYSTERECTOMY WITH BILATERAL SALPINGO OOPHORECTOMY Bilateral 01/21/2020   Procedure: TOTAL LAPAROSCOPIC HYSTERECTOMY WITH BILATERAL SALPINGO OOPHORECTOMY;  Surgeon: Megan Salon, MD;  Location: [facility];  Service: Gynecology;  Laterality: Bilateral;   TUBAL LIGATION  VAGINAL PROLAPSE REPAIR N/A 01/21/2020   Procedure: REPAIR VAGINAL VAULT SUSPENSION AND GRIFT;  Surgeon: Bjorn Loser, MD;  Location: [facility];  Service: Urology;  Laterality: N/A;   Family History  Problem Relation Age of Onset   Hyperlipidemia Mother    Hypertension Mother    Aneurysm Mother    Hyperlipidemia Father    Breast cancer Neg Hx    Outpatient Medications Prior to Visit  Medication Sig Dispense  Refill   amLODipine-valsartan (EXFORGE) 5-160 MG tablet Take 1 tablet by mouth daily. 90 tablet 0   atorvastatin (LIPITOR) 20 MG tablet Take 1 tablet (20 mg total) by mouth every other day. 45 tablet 3   cholecalciferol (VITAMIN D3) 25 MCG (1000 UNIT) tablet Take 1,000 Units by mouth daily.     magnesium 30 MG tablet Take 30 mg by mouth 2 (two) times daily.     Multiple Vitamin (MULTIVITAMIN) tablet Take 1 tablet by mouth daily.     Omega-3 Fatty Acids (FISH OIL) 1000 MG CAPS Take by mouth.     amLODipine-valsartan (EXFORGE) 5-160 MG tablet Take 1 tablet by mouth daily. 90 tablet 3   Blood Pressure Monitoring (OMRON 3 SERIES BP MONITOR) DEVI Use as directed 1 each 0   No facility-administered medications prior to visit.   Allergies  Allergen Reactions   Ace Inhibitors     cough    Objective:   Today's Vitals   12/29/21 0849  BP: 126/80  Pulse: 67  Temp: (!) 97.1 F (36.2 C)  TempSrc: Temporal  SpO2: 99%  Weight: 143 lb (64.9 kg)  Height: '5\' 3"'$  (1.6 m)   Body mass index is 25.33 kg/m.   General: Well developed, well nourished. No acute distress. HEENT: Normocephalic, non-traumatic. PERRL, EOMI. Conjunctiva clear. Fundiscopic exam difficult due to   cataracts. External ears normal. EAC and TMs normal bilaterally. Mild wax in EAC. Nose  with moderate   congestion and mild clear rhinorrhea. Mucous membranes moist. Oropharynx with mild cobblestoning. Good   dentition. Neck: Supple. No lymphadenopathy. No thyromegaly. No carotid bruits. Lungs: Clear to auscultation bilaterally. No wheezing, rales or rhonchi. CV: RRR without murmurs or rubs. Pulses 2+ bilaterally. Abdomen: Soft, non-tender. Bowel sounds positive, normal pitch and frequency. No hepatosplenomegaly. No   rebound or guarding. Extremities: Full ROM. No joint swelling or tenderness. No edema noted. Tinel's and Phalen's test neg. at right   wrist. Skin: Warm and dry. No rashes. Psych: Alert and oriented. Mildly  depressed mood and affect.  Health Maintenance Due  Topic Date Due   Zoster Vaccines- Shingrix (1 of 2) Never done   COLONOSCOPY (Pts 45-89yr Insurance coverage will need to be confirmed)  08/17/2021   PAP SMEAR-Modifier  10/03/2021     Assessment & Plan:   1. Annual physical exam Overall good health. We reviewed recommended screenings. I advised agaisnt the need for the screenings that [facility] offers.  2. Essential hypertension Blood pressure is in good control today. We will continue her on ExForge 5-160 mg daily.  - Comprehensive metabolic panel  3. Mixed hyperlipidemia Continue atorvastatin 20 mg qod. I will check lipids today.  - Lipid panel - Ambulatory referral to Vascular Surgery  4. Prediabetes Due for repeat A1c.  - Hemoglobin A1c  5. Poor peripheral circulation I will check some screening labs. I do not feel Ms. Strycharz is particularly at high risk. However, having her ABI assessed would help to put her mind at ease.  - Comprehensive metabolic panel - TSH -  Ambulatory referral to Vascular Surgery  6. Seasonal allergic rhinitis due to pollen The mild cough and physical exam are consistent with allergic rhinitis with post-nasal drip. I recommend she consider using a daily nasal steroid spray through the allergy season.  7. Grief Ms. Tyminski's husband died about 3 weeks ago. She appears to be appropriately grieving his loss. She does have supports int he local community.  8. Screening for colon cancer  - Ambulatory referral to Gastroenterology  Return in about 4 months (around 04/30/2022) for Reassessment.   Haydee Salter, MD

## 2021-12-30 ENCOUNTER — Encounter: Payer: Self-pay | Admitting: Family Medicine

## 2022-01-13 DIAGNOSIS — H524 Presbyopia: Secondary | ICD-10-CM | POA: Diagnosis not present

## 2022-01-19 ENCOUNTER — Other Ambulatory Visit (HOSPITAL_BASED_OUTPATIENT_CLINIC_OR_DEPARTMENT_OTHER): Payer: Self-pay

## 2022-01-21 ENCOUNTER — Other Ambulatory Visit: Payer: Self-pay | Admitting: *Deleted

## 2022-01-21 DIAGNOSIS — M25569 Pain in unspecified knee: Secondary | ICD-10-CM

## 2022-02-09 ENCOUNTER — Ambulatory Visit (INDEPENDENT_AMBULATORY_CARE_PROVIDER_SITE_OTHER): Payer: 59 | Admitting: Vascular Surgery

## 2022-02-09 ENCOUNTER — Ambulatory Visit (HOSPITAL_COMMUNITY)
Admission: RE | Admit: 2022-02-09 | Discharge: 2022-02-09 | Disposition: A | Discharge status: Home / Self Care | Payer: 59 | Source: Ambulatory Visit | Attending: Vascular Surgery | Admitting: Vascular Surgery

## 2022-02-09 ENCOUNTER — Other Ambulatory Visit: Payer: Self-pay | Admitting: Vascular Surgery

## 2022-02-09 ENCOUNTER — Encounter: Payer: Self-pay | Admitting: Vascular Surgery

## 2022-02-09 VITALS — BP 131/85 | HR 55 | Temp 98.0°F | Resp 20 | Ht 63.0 in | Wt 140.0 lb

## 2022-02-09 DIAGNOSIS — M25569 Pain in unspecified knee: Secondary | ICD-10-CM | POA: Diagnosis not present

## 2022-02-09 DIAGNOSIS — I739 Peripheral vascular disease, unspecified: Secondary | ICD-10-CM | POA: Diagnosis not present

## 2022-02-09 NOTE — Progress Notes (Signed)
Patient ID: Norma Montemurro, female   DOB: 1961-01-26, 61 y.o.   MRN: [id number]  Reason for Consult: New Patient (Initial Visit)   Referred by Haydee Salter, MD  Subjective:     HPI:  Kiyani Jernigan is a 61 y.o. female without significant vascular disease.  She does have hypertension, hyperlipidemia and has been diagnosed with prediabetes.  She has discoloration of her hands more than her feet and occasional sweating.  She has never had any vascular disease is a lifelong non-smoker.  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  She denies any tissue loss or ulceration.  No history of stroke, TIA or amaurosis.  Past Medical History:  Diagnosis Date   Anemia    Hyperlipidemia    Hypertension    Pre-diabetes    Family History  Problem Relation Age of Onset   Hyperlipidemia Mother    Hypertension Mother    Aneurysm Mother    Hyperlipidemia Father    Breast cancer Neg Hx    Past Surgical History:  Procedure Laterality Date   CYSTOCELE REPAIR N/A 01/21/2020   Procedure: REAPIR ANTERIOR REPAIR (CYSTOCELE) CYSTOSCOPY;  Surgeon: Bjorn Loser, MD;  Location: [facility];  Service: Urology;  Laterality: N/A;   TOTAL LAPAROSCOPIC HYSTERECTOMY WITH BILATERAL SALPINGO OOPHORECTOMY Bilateral 01/21/2020   Procedure: TOTAL LAPAROSCOPIC HYSTERECTOMY WITH BILATERAL SALPINGO OOPHORECTOMY;  Surgeon: Megan Salon, MD;  Location: [facility];  Service: Gynecology;  Laterality: Bilateral;   TUBAL LIGATION     VAGINAL PROLAPSE REPAIR N/A 01/21/2020   Procedure: REPAIR VAGINAL VAULT SUSPENSION AND GRIFT;  Surgeon: Bjorn Loser, MD;  Location: [facility];  Service: Urology;  Laterality: N/A;    Short Social History:  Social History   Tobacco Use   Smoking status: Never   Smokeless tobacco: Never  Substance Use Topics   Alcohol use: No    Allergies  Allergen Reactions   Ace Inhibitors     cough    Current Outpatient Medications  Medication Sig Dispense  Refill   amLODipine-valsartan (EXFORGE) 5-160 MG tablet Take 1 tablet by mouth daily. 90 tablet 0   atorvastatin (LIPITOR) 20 MG tablet Take 1 tablet (20 mg total) by mouth every other day. 45 tablet 3   cholecalciferol (VITAMIN D3) 25 MCG (1000 UNIT) tablet Take 1,000 Units by mouth daily.     magnesium 30 MG tablet Take 30 mg by mouth 2 (two) times daily.     Multiple Vitamin (MULTIVITAMIN) tablet Take 1 tablet by mouth daily.     Omega-3 Fatty Acids (FISH OIL) 1000 MG CAPS Take by mouth.     No current facility-administered medications for this visit.    Review of Systems  Constitutional:  Constitutional negative. HENT: HENT negative.  Eyes: Eyes negative.  Respiratory: Respiratory negative.  Cardiovascular: Cardiovascular negative.  GI: Gastrointestinal negative.  Musculoskeletal:       Finger and toe discoloration sweating Neurological: Neurological negative. Hematologic: Hematologic/lymphatic negative.  Psychiatric: Psychiatric negative.        Objective:  Objective   Vitals:   02/09/22 1338  BP: 131/85  Pulse: (!) 55  Resp: 20  Temp: 98 F (36.7 C)  SpO2: 95%  Weight: 140 lb (63.5 kg)  Height: '5\' 3"'$  (1.6 m)   Body mass index is 24.8 kg/m.  Physical Exam HENT:     Head: Normocephalic.     Nose: Nose normal.  Eyes:     Pupils: Pupils are equal, round, and reactive to  light.  Cardiovascular:     Pulses: Normal pulses.  Pulmonary:     Effort: Pulmonary effort is normal.     Breath sounds: Normal breath sounds.  Abdominal:     General: Abdomen is flat.     Palpations: Abdomen is soft.  Musculoskeletal:        General: Normal range of motion.     Cervical back: Normal range of motion and neck supple.     Right lower leg: No edema.     Left lower leg: No edema.     Comments: Minimal telangiectasias right thigh  Skin:    General: Skin is warm.     Capillary Refill: Capillary refill takes less than 2 seconds.  Neurological:     General: No focal  deficit present.     Mental Status: She is alert.  Psychiatric:        Mood and Affect: Mood normal.        Behavior: Behavior normal.     Data: ABI Findings:  +---------+------------------+-----+---------+--------+  Right    Rt Pressure (mmHg)IndexWaveform Comment   +---------+------------------+-----+---------+--------+  Brachial 137                                       +---------+------------------+-----+---------+--------+  PTA      162               1.12 triphasic          +---------+------------------+-----+---------+--------+  DP       153               1.06 triphasic          +---------+------------------+-----+---------+--------+  Great Toe150               1.04 Normal             +---------+------------------+-----+---------+--------+   +---------+------------------+-----+----------------+-------+  Left     Lt Pressure (mmHg)IndexWaveform        Comment  +---------+------------------+-----+----------------+-------+  Brachial 144                                             +---------+------------------+-----+----------------+-------+  PTA      159               1.10 barely triphasic         +---------+------------------+-----+----------------+-------+  DP       154               1.07 triphasic                +---------+------------------+-----+----------------+-------+  Great Toe105               0.73 Normal                   +---------+------------------+-----+----------------+-------+   +-------+-----------+-----------+------------+------------+  ABI/TBIToday's ABIToday's TBIPrevious ABIPrevious TBI  +-------+-----------+-----------+------------+------------+  Right  1.13       1.04                                 +-------+-----------+-----------+------------+------------+  Left   1.10       .73                                  +-------+-----------+-----------+------------+------------+  Assessment/Plan:    61 year old female with symptoms as above.  Appears to be related to autonomic dysfunction although symptoms are mild.  Vascular intact.  She can follow-up as an as-needed basis.     Waynetta Sandy MD [facility]

## 2022-02-10 ENCOUNTER — Other Ambulatory Visit (HOSPITAL_BASED_OUTPATIENT_CLINIC_OR_DEPARTMENT_OTHER): Payer: Self-pay

## 2022-02-10 ENCOUNTER — Other Ambulatory Visit: Payer: Self-pay | Admitting: Family Medicine

## 2022-02-10 DIAGNOSIS — I10 Essential (primary) hypertension: Secondary | ICD-10-CM

## 2022-02-10 MED ORDER — AMLODIPINE BESYLATE-VALSARTAN 5-160 MG PO TABS
1.0000 | ORAL_TABLET | Freq: Every day | ORAL | 3 refills | Status: DC
  Filled 2022-02-10: qty 90, 90d supply, fill #0
  Filled 2022-05-13: qty 90, 90d supply, fill #1
  Filled 2022-08-12 (×2): qty 90, 90d supply, fill #2
  Filled 2022-11-14: qty 90, 90d supply, fill #3

## 2022-04-19 ENCOUNTER — Other Ambulatory Visit (HOSPITAL_BASED_OUTPATIENT_CLINIC_OR_DEPARTMENT_OTHER): Payer: Self-pay

## 2022-05-09 ENCOUNTER — Other Ambulatory Visit (HOSPITAL_BASED_OUTPATIENT_CLINIC_OR_DEPARTMENT_OTHER): Payer: Self-pay

## 2022-05-09 MED ORDER — INFLUENZA VAC SPLIT QUAD 0.5 ML IM SUSY
PREFILLED_SYRINGE | INTRAMUSCULAR | 0 refills | Status: DC
  Filled 2022-05-09: qty 0.5, 1d supply, fill #0

## 2022-05-11 ENCOUNTER — Encounter: Payer: Self-pay | Admitting: Internal Medicine

## 2022-05-13 ENCOUNTER — Other Ambulatory Visit (HOSPITAL_BASED_OUTPATIENT_CLINIC_OR_DEPARTMENT_OTHER): Payer: Self-pay

## 2022-05-17 ENCOUNTER — Other Ambulatory Visit (HOSPITAL_BASED_OUTPATIENT_CLINIC_OR_DEPARTMENT_OTHER): Payer: Self-pay

## 2022-05-25 ENCOUNTER — Other Ambulatory Visit (HOSPITAL_BASED_OUTPATIENT_CLINIC_OR_DEPARTMENT_OTHER): Payer: Self-pay

## 2022-05-25 ENCOUNTER — Ambulatory Visit (AMBULATORY_SURGERY_CENTER): Payer: Self-pay

## 2022-05-25 VITALS — Ht 63.0 in | Wt 145.0 lb

## 2022-05-25 DIAGNOSIS — Z1211 Encounter for screening for malignant neoplasm of colon: Secondary | ICD-10-CM

## 2022-05-25 MED ORDER — NA SULFATE-K SULFATE-MG SULF 17.5-3.13-1.6 GM/177ML PO SOLN
1.0000 | ORAL | 0 refills | Status: DC
  Filled 2022-05-25: qty 354, 1d supply, fill #0

## 2022-05-25 NOTE — Progress Notes (Signed)
No egg or soy allergy known to patient  No issues known to pt with past sedation with any surgeries or procedures Patient denies ever being told they had issues or difficulty with intubation  No FH of Malignant Hyperthermia Pt is not on diet pills Pt is not on  home 02  Pt is not on blood thinners  Pt denies issues with constipation  No A fib or A flutter Have any cardiac testing pending--denied Pt instructed to use Singlecare.com or GoodRx for a price reduction on prep   Patient's chart reviewed by Joan Morgan CNRA prior to previsit and patient appropriate for the [facility].  Previsit completed and red dot placed by patient's name on their procedure day (on provider's schedule).    

## 2022-06-08 ENCOUNTER — Ambulatory Visit: Payer: 59 | Admitting: Family Medicine

## 2022-06-08 ENCOUNTER — Encounter: Payer: Self-pay | Admitting: Family Medicine

## 2022-06-08 VITALS — BP 112/68 | HR 68 | Temp 97.8°F | Ht 63.0 in | Wt 143.2 lb

## 2022-06-08 DIAGNOSIS — Z1231 Encounter for screening mammogram for malignant neoplasm of breast: Secondary | ICD-10-CM | POA: Diagnosis not present

## 2022-06-08 DIAGNOSIS — I10 Essential (primary) hypertension: Secondary | ICD-10-CM | POA: Diagnosis not present

## 2022-06-08 DIAGNOSIS — R200 Anesthesia of skin: Secondary | ICD-10-CM | POA: Diagnosis not present

## 2022-06-08 DIAGNOSIS — R202 Paresthesia of skin: Secondary | ICD-10-CM

## 2022-06-08 DIAGNOSIS — R7303 Prediabetes: Secondary | ICD-10-CM

## 2022-06-08 NOTE — Progress Notes (Signed)
[facility] [address] [place] [address] Dept: [phone] Dept Fax: [phone]  Chronic Care Office Visit  Subjective:    Patient ID: Joan Morgan, female    DOB: [birth year], 61 y.o..   MRN: [id number]  Chief Complaint  Patient presents with   Follow-up    F/u meds.      History of Present Illness:  Patient is in today for reassessment of chronic medical issues.  [name] has hypertension. She is managed on amlodipine-valsartan (Exforge) 5-160 mg daily. She notes that she has an occasional surge in her blood pressure. She wonders if she needs additional medicine to take at those times.   [name] has a history of prediabetes. her last A1c int he [place] was 6.2%. She would like a prescription for a home glucose meter, so she could check her sugars more frequently.  At her last visit, [name] complained of possible circulation issues with her lweor legs/feet. I had her seen by vascular, but her blood flow was found to be normal. Now, she says she gets intermittent, brief episodes of tingling in her low legs/feet. This usually occurs after sitting for a long period of time. She wonders if she should see a neurologist to evaluate for neuropathy. [name] does not routinely drink alcohol and has no history of diabetes.   Past Medical History: Patient Active Problem List   Diagnosis Date Noted   Cataract cortical, senile, bilateral 12/29/2021   Prediabetes 12/31/2019   Chronic pain of both knees 06/20/2019   Constipation 08/16/2017   Hyperlipidemia 10/14/2014   Essential hypertension    Past Surgical History:  Procedure Laterality Date   CYSTOCELE REPAIR N/A 01/21/2020   Procedure: REAPIR ANTERIOR REPAIR (CYSTOCELE) CYSTOSCOPY;  Surgeon: Bjorn Loser, MD;  [facility];  Service: Urology;  Laterality: N/A;   TOTAL LAPAROSCOPIC HYSTERECTOMY WITH BILATERAL SALPINGO OOPHORECTOMY  Bilateral 01/21/2020   Procedure: TOTAL LAPAROSCOPIC HYSTERECTOMY WITH BILATERAL SALPINGO OOPHORECTOMY;  Surgeon: Megan Salon, MD;  [facility];  Service: Gynecology;  Laterality: Bilateral;   TUBAL LIGATION     VAGINAL PROLAPSE REPAIR N/A 01/21/2020   Procedure: REPAIR VAGINAL VAULT SUSPENSION AND GRIFT;  Surgeon: Bjorn Loser, MD;  [facility];  Service: Urology;  Laterality: N/A;   Family History  Problem Relation Age of Onset   Hyperlipidemia Mother    Hypertension Mother    Aneurysm Mother    Colon polyps Father    Hyperlipidemia Father    Breast cancer Neg Hx    Colon cancer Neg Hx    Esophageal cancer Neg Hx    Stomach cancer Neg Hx    Rectal cancer Neg Hx    Outpatient Medications Prior to Visit  Medication Sig Dispense Refill   amLODipine-valsartan (EXFORGE) 5-160 MG tablet Take 1 tablet by mouth daily. 90 tablet 3   atorvastatin (LIPITOR) 20 MG tablet Take 1 tablet (20 mg total) by mouth every other day. 45 tablet 3   cholecalciferol (VITAMIN D3) 25 MCG (1000 UNIT) tablet Take 1,000 Units by mouth daily.     magnesium 30 MG tablet Take 30 mg by mouth 2 (two) times daily.     Multiple Vitamin (MULTIVITAMIN) tablet Take 1 tablet by mouth daily.     Na Sulfate-K Sulfate-Mg Sulf 17.5-3.13-1.6 GM/177ML SOLN Take as directed. 354 mL 0   Omega-3 Fatty Acids (FISH OIL) 1000 MG CAPS Take by mouth.     influenza vac split quadrivalent PF (FLUARIX)  0.5 ML injection Inject into the muscle. (Patient not taking: Reported on 05/25/2022) 0.5 mL 0   No facility-administered medications prior to visit.   Allergies  Allergen Reactions   Ace Inhibitors     cough     Objective:   Today's Vitals   06/08/22 0802  BP: 112/68  Pulse: 68  Temp: 97.8 F (36.6 C)  TempSrc: Temporal  SpO2: 95%  Weight: 143 lb 3.2 oz (65 kg)  Height: '5\' 3"'$  (1.6 m)   Body mass index is 25.37 kg/m.   General: Well developed, well nourished. No acute distress. Psych: Alert and oriented.  Normal mood and affect.  Health Maintenance Due  Topic Date Due   Zoster Vaccines- Shingrix (1 of 2) Never done   COLONOSCOPY (Pts 45-31yr Insurance coverage will need to be confirmed)  08/17/2021   MAMMOGRAM  04/20/2022   Lab Results Last metabolic panel Lab Results  Component Value Date   GLUCOSE 108 (H) 12/29/2021   NA 139 12/29/2021   K 3.6 12/29/2021   CL 100 12/29/2021   CO2 29 12/29/2021   BUN 12 12/29/2021   CREATININE 0.78 12/29/2021   GFRNONAA >60 01/22/2020   CALCIUM 10.6 (H) 12/29/2021   PROT 8.1 12/29/2021   ALBUMIN 4.7 12/29/2021   BILITOT 0.8 12/29/2021   ALKPHOS 77 12/29/2021   AST 20 12/29/2021   ALT 20 12/29/2021   ANIONGAP 9 01/22/2020   Last lipids Lab Results  Component Value Date   CHOL 183 12/29/2021   HDL 63.30 12/29/2021   LDLCALC 100 (H) 12/29/2021   LDLDIRECT 159.7 10/24/2012   TRIG 98.0 12/29/2021   CHOLHDL 3 12/29/2021   Last hemoglobin A1c Lab Results  Component Value Date   HGBA1C 6.2 12/29/2021   Assessment & Plan:   1. Essential hypertension Blood pressure is at goal today. I do not recommend that she take extra doses of a BP med during rare episodes where her BP runs higher, as her usual pressure is very normal. We will continue to monitor this at home and in the office. She will continue on Exforge 5-160 mg daily.  2. Prediabetes I do not recommend that Ms. Wanek monitor her blood sugars at home, as this would not be helpful for preventing diabetes. She should focus on keeping her weight under control, regular exercise, and eating a healthy diet. We will continue annual monitoring of her diabetes and A1c.  3. Intermittent tingling of both legs The intermittent tingling sounds like a normal issue that can occur after prolonged sitting. She has no historical issues or risk factors to put her at place for a peripheral neuropathy. I do not feel there is a role for her to see neurology at this time.  4. Encounter for  screening mammogram for malignant neoplasm of breast  - MM DIGITAL SCREENING BILATERAL; Future  Return in about 6 months (around 12/07/2022) for Reassessment.   SHaydee Salter MD

## 2022-06-14 ENCOUNTER — Encounter: Payer: Self-pay | Admitting: Internal Medicine

## 2022-06-16 ENCOUNTER — Encounter: Payer: 59 | Admitting: Internal Medicine

## 2022-06-21 ENCOUNTER — Ambulatory Visit (AMBULATORY_SURGERY_CENTER): Payer: 59 | Admitting: Internal Medicine

## 2022-06-21 ENCOUNTER — Encounter: Payer: Self-pay | Admitting: Internal Medicine

## 2022-06-21 VITALS — BP 123/68 | HR 54 | Temp 98.1°F | Resp 14 | Ht 63.0 in | Wt 145.0 lb

## 2022-06-21 DIAGNOSIS — D120 Benign neoplasm of cecum: Secondary | ICD-10-CM | POA: Diagnosis not present

## 2022-06-21 DIAGNOSIS — D123 Benign neoplasm of transverse colon: Secondary | ICD-10-CM | POA: Diagnosis not present

## 2022-06-21 DIAGNOSIS — D124 Benign neoplasm of descending colon: Secondary | ICD-10-CM | POA: Diagnosis not present

## 2022-06-21 DIAGNOSIS — E785 Hyperlipidemia, unspecified: Secondary | ICD-10-CM | POA: Diagnosis not present

## 2022-06-21 DIAGNOSIS — I10 Essential (primary) hypertension: Secondary | ICD-10-CM | POA: Diagnosis not present

## 2022-06-21 DIAGNOSIS — D122 Benign neoplasm of ascending colon: Secondary | ICD-10-CM

## 2022-06-21 DIAGNOSIS — Z1211 Encounter for screening for malignant neoplasm of colon: Secondary | ICD-10-CM | POA: Diagnosis not present

## 2022-06-21 DIAGNOSIS — R7303 Prediabetes: Secondary | ICD-10-CM | POA: Diagnosis not present

## 2022-06-21 MED ORDER — SODIUM CHLORIDE 0.9 % IV SOLN: 500.0000 mL | INTRAVENOUS | Status: DC

## 2022-06-21 NOTE — Progress Notes (Signed)
GASTROENTEROLOGY PROCEDURE H&P NOTE   Primary Care Physician: Haydee Salter, MD    Reason for Procedure:   Colon cancer screening  Plan:    Colonoscopy  Patient is appropriate for endoscopic procedure(s) in the ambulatory ([place]) setting.  The nature of the procedure, as well as the risks, benefits, and alternatives were carefully and thoroughly reviewed with the patient. Ample time for discussion and questions allowed. The patient understood, was satisfied, and agreed to proceed.     HPI: Joan Morgan is a 61 y.o. female who presents for colonoscopy for colon cancer screening. Denies blood in stools, changes in bowel habits, or unintentional weight loss. Denies family history of colon cancer. Last colonoscopy 10 years ago was normal.   Past Medical History:  Diagnosis Date   Anemia    Hyperlipidemia    Hypertension    Pre-diabetes     Past Surgical History:  Procedure Laterality Date   CYSTOCELE REPAIR N/A 01/21/2020   Procedure: REAPIR ANTERIOR REPAIR (CYSTOCELE) CYSTOSCOPY;  Surgeon: Bjorn Loser, MD;  [facility];  Service: Urology;  Laterality: N/A;   TOTAL LAPAROSCOPIC HYSTERECTOMY WITH BILATERAL SALPINGO OOPHORECTOMY Bilateral 01/21/2020   Procedure: TOTAL LAPAROSCOPIC HYSTERECTOMY WITH BILATERAL SALPINGO OOPHORECTOMY;  Surgeon: Megan Salon, MD;  Location: [facility];  Service: Gynecology;  Laterality: Bilateral;   TUBAL LIGATION     VAGINAL PROLAPSE REPAIR N/A 01/21/2020   Procedure: REPAIR VAGINAL VAULT SUSPENSION AND GRIFT;  Surgeon: Bjorn Loser, MD;  [facility];  Service: Urology;  Laterality: N/A;    Prior to Admission medications   Medication Sig Start Date End Date Taking? Authorizing Provider  amLODipine-valsartan (EXFORGE) 5-160 MG tablet Take 1 tablet by mouth daily. 02/10/22  Yes Haydee Salter, MD  atorvastatin (LIPITOR) 20 MG tablet Take 1 tablet (20 mg total) by mouth every other day. 10/25/21  Yes Haydee Salter, MD   cholecalciferol (VITAMIN D3) 25 MCG (1000 UNIT) tablet Take 1,000 Units by mouth daily.   Yes [provider]  magnesium 30 MG tablet Take 30 mg by mouth 2 (two) times daily.   Yes [provider]  Multiple Vitamin (MULTIVITAMIN) tablet Take 1 tablet by mouth daily.   Yes [provider]  Omega-3 Fatty Acids (FISH OIL) 1000 MG CAPS Take by mouth.   Yes [provider]    Current Outpatient Medications  Medication Sig Dispense Refill   amLODipine-valsartan (EXFORGE) 5-160 MG tablet Take 1 tablet by mouth daily. 90 tablet 3   atorvastatin (LIPITOR) 20 MG tablet Take 1 tablet (20 mg total) by mouth every other day. 45 tablet 3   cholecalciferol (VITAMIN D3) 25 MCG (1000 UNIT) tablet Take 1,000 Units by mouth daily.     magnesium 30 MG tablet Take 30 mg by mouth 2 (two) times daily.     Multiple Vitamin (MULTIVITAMIN) tablet Take 1 tablet by mouth daily.     Omega-3 Fatty Acids (FISH OIL) 1000 MG CAPS Take by mouth.     Current Facility-Administered Medications  Medication Dose Route Frequency Provider Last Rate Last Admin   0.9 %  sodium chloride infusion  500 mL Intravenous Continuous Sharyn Creamer, MD        Allergies as of 06/21/2022 - Review Complete 06/21/2022  Allergen Reaction Noted   Ace inhibitors  02/12/2015    Family History  Problem Relation Age of Onset   Hyperlipidemia Mother    Hypertension Mother    Aneurysm Mother    Colon polyps  Father    Hyperlipidemia Father    Breast cancer Neg Hx    Colon cancer Neg Hx    Esophageal cancer Neg Hx    Stomach cancer Neg Hx    Rectal cancer Neg Hx     Social History   Socioeconomic History   Marital status: Married    Spouse name: Not on file   Number of children: Not on file   Years of education: Not on file   Highest education level: Not on file  Occupational History   Not on file  Tobacco Use   Smoking status: Never   Smokeless tobacco: Never  Vaping Use   Vaping Use: Never  used  Substance and Sexual Activity   Alcohol use: No   Drug use: No   Sexual activity: Not Currently    Birth control/protection: Post-menopausal  Other Topics Concern   Not on file  Social History Narrative   From [place].   Married, 2 sons 1 in [place] Music therapist and another in [place]   Works as Quarry manager at [employer]: Not on [employer] Insecurity: Not on file  Transportation Needs: Not on file  Physical Activity: Not on file  Stress: Not on file  Social Connections: Not on file  Intimate Partner Violence: Not on file    Physical Exam: Vital signs in last 24 hours: BP (!) 146/91   Pulse 64   Temp 98.1 F (36.7 C)   Ht '5\' 3"'$  (1.6 m)   Wt 145 lb (65.8 kg)   LMP 08/01/2009 (Approximate)   SpO2 99%   BMI 25.69 kg/m  GEN: NAD EYE: Sclerae anicteric ENT: MMM CV: Non-tachycardic Pulm: No increased work of breathing GI: Soft, NT/ND NEURO:  Alert & Oriented   Christia Reading, MD [facility]  06/21/2022 8:47 AM

## 2022-06-21 NOTE — Progress Notes (Signed)
A and O x3. Report to RN. Tolerated MAC anesthesia well. 

## 2022-06-21 NOTE — Op Note (Signed)
[facility] Patient Name: Joan Morgan Procedure Date: 06/21/2022 9:10 AM MRN: [id number] Endoscopist: Georgian Co , , [id number] Age: 61 Referring MD:  Date of Birth: [birth year] Gender: Female Account #: [id number] Procedure:                Colonoscopy Indications:              Screening for colorectal malignant neoplasm Medicines:                Monitored Anesthesia Care Procedure:                Pre-Anesthesia Assessment:                           - Prior to the procedure, a History and Physical                            was performed, and patient medications and                            allergies were reviewed. The patient's tolerance of                            previous anesthesia was also reviewed. The risks                            and benefits of the procedure and the sedation                            options and risks were discussed with the patient.                            All questions were answered, and informed consent                            was obtained. Prior Anticoagulants: The patient has                            taken no anticoagulant or antiplatelet agents. ASA                            Grade Assessment: II - A patient with mild systemic                            disease. After reviewing the risks and benefits,                            the patient was deemed in satisfactory condition to                            undergo the procedure.                           After obtaining informed consent, the colonoscope  was passed under direct vision. Throughout the                            procedure, the patient's blood pressure, pulse, and                            oxygen saturations were monitored continuously. The                            Olympus Scope [id number] was introduced through the                            anus and advanced to the the terminal ileum. The                             colonoscopy was performed without difficulty. The                            patient tolerated the procedure well. The quality                            of the bowel preparation was good. The terminal                            ileum, ileocecal valve, appendiceal orifice, and                            rectum were photographed. Scope In: 9:23:15 AM Scope Out: 9:53:09 AM Scope Withdrawal Time: 0 hours 23 minutes 26 seconds  Total Procedure Duration: 0 hours 29 minutes 54 seconds  Findings:                 The terminal ileum appeared normal.                           A 2 mm polyp was found in the cecum. The polyp was                            sessile. The polyp was removed with a cold biopsy                            forceps. Resection and retrieval were complete.                           Three sessile polyps were found in the transverse                            colon and ascending colon. The polyps were 3 to 6                            mm in size. These polyps were removed with a cold  snare. Resection and retrieval were complete.                           A 4 mm polyp was found in the descending colon. The                            polyp was sessile. The polyp was removed with a                            cold snare. Resection and retrieval were complete.                           Non-bleeding internal hemorrhoids were found during                            retroflexion. Complications:            No immediate complications. Estimated Blood Loss:     Estimated blood loss was minimal. Impression:               - The examined portion of the ileum was normal.                           - One 2 mm polyp in the cecum, removed with a cold                            biopsy forceps. Resected and retrieved.                           - Three 3 to 6 mm polyps in the transverse colon                            and in the ascending colon, removed with a cold                             snare. Resected and retrieved.                           - One 4 mm polyp in the descending colon, removed                            with a cold snare. Resected and retrieved.                           - Non-bleeding internal hemorrhoids. Recommendation:           - Discharge patient to home (with escort).                           - Await pathology results.                           - The findings and recommendations were discussed  with the patient. Dr Georgian Co "Lyndee Leo" Lorenso Courier,  06/21/2022 9:56:56 AM

## 2022-06-21 NOTE — Progress Notes (Signed)
Called to room to assist during endoscopic procedure.  Patient ID and intended procedure confirmed with present staff. Received instructions for my participation in the procedure from the performing physician.  

## 2022-06-21 NOTE — Patient Instructions (Signed)
Handouts on polyps and hemorrhoids given to patient. Await pathology results. Resume previous diet and continue present medications. Repeat colonoscopy for surveillance will be determined based off of pathology results.   YOU HAD AN ENDOSCOPIC PROCEDURE TODAY AT THE [facility]:   Refer to the procedure report that was given to you for any specific questions about what was found during the examination.  If the procedure report does not answer your questions, please call your gastroenterologist to clarify.  If you requested that your care partner not be given the details of your procedure findings, then the procedure report has been included in a sealed envelope for you to review at your convenience later.  YOU SHOULD EXPECT: Some feelings of bloating in the abdomen. Passage of more gas than usual.  Walking can help get rid of the air that was put into your GI tract during the procedure and reduce the bloating. If you had a lower endoscopy (such as a colonoscopy or flexible sigmoidoscopy) you may notice spotting of blood in your stool or on the toilet paper. If you underwent a bowel prep for your procedure, you may not have a normal bowel movement for a few days.  Please Note:  You might notice some irritation and congestion in your nose or some drainage.  This is from the oxygen used during your procedure.  There is no need for concern and it should clear up in a day or so.  SYMPTOMS TO REPORT IMMEDIATELY:  Following lower endoscopy (colonoscopy or flexible sigmoidoscopy):  Excessive amounts of blood in the stool  Significant tenderness or worsening of abdominal pains  Swelling of the abdomen that is new, acute  Fever of 100F or higher  For urgent or emergent issues, a gastroenterologist can be reached at any hour by calling [phone]. Do not use MyChart messaging for urgent concerns.    DIET:  We do recommend a small meal at first, but then you may proceed to your regular  diet.  Drink plenty of fluids but you should avoid alcoholic beverages for 24 hours.  ACTIVITY:  You should plan to take it easy for the rest of today and you should NOT DRIVE or use heavy machinery until tomorrow (because of the sedation medicines used during the test).    FOLLOW UP: Our staff will call the number listed on your records the next business day following your procedure.  We will call around 7:15- 8:00 am to check on you and address any questions or concerns that you may have regarding the information given to you following your procedure. If we do not reach you, we will leave a message.     If any biopsies were taken you will be contacted by phone or by letter within the next 1-3 weeks.  Please call us at [phone] if you have not heard about the biopsies in 3 weeks.    SIGNATURES/CONFIDENTIALITY: You and/or your care partner have signed paperwork which will be entered into your electronic medical record.  These signatures attest to the fact that that the information above on your After Visit Summary has been reviewed and is understood.  Full responsibility of the confidentiality of this discharge information lies with you and/or your care-partner. 

## 2022-06-22 ENCOUNTER — Telehealth: Payer: Self-pay | Admitting: *Deleted

## 2022-06-22 NOTE — Telephone Encounter (Signed)
  Follow up Call-     06/21/2022    8:06 AM  Call back number  Post procedure Call Back phone  # [phone]  Permission to leave phone message Yes     Patient questions:  Message left to call us if necessary.

## 2022-06-28 ENCOUNTER — Encounter: Payer: Self-pay | Admitting: Internal Medicine

## 2022-08-11 ENCOUNTER — Ambulatory Visit
Admission: RE | Admit: 2022-08-11 | Discharge: 2022-08-11 | Disposition: A | Discharge status: Home / Self Care | Payer: 59 | Source: Ambulatory Visit | Attending: Family Medicine | Admitting: Family Medicine

## 2022-08-11 DIAGNOSIS — Z1231 Encounter for screening mammogram for malignant neoplasm of breast: Secondary | ICD-10-CM | POA: Diagnosis not present

## 2022-08-12 ENCOUNTER — Other Ambulatory Visit (HOSPITAL_BASED_OUTPATIENT_CLINIC_OR_DEPARTMENT_OTHER): Payer: Self-pay

## 2022-10-26 ENCOUNTER — Other Ambulatory Visit (HOSPITAL_BASED_OUTPATIENT_CLINIC_OR_DEPARTMENT_OTHER): Payer: Self-pay

## 2022-10-26 ENCOUNTER — Other Ambulatory Visit: Payer: Self-pay | Admitting: Family Medicine

## 2022-10-26 DIAGNOSIS — E782 Mixed hyperlipidemia: Secondary | ICD-10-CM

## 2022-10-26 MED ORDER — ATORVASTATIN CALCIUM 20 MG PO TABS
20.0000 mg | ORAL_TABLET | ORAL | 3 refills | Status: DC
  Filled 2022-10-26: qty 45, 90d supply, fill #0
  Filled 2023-01-26: qty 45, 90d supply, fill #1
  Filled 2023-05-03: qty 45, 90d supply, fill #2
  Filled 2023-08-12: qty 45, 90d supply, fill #3

## 2022-11-14 ENCOUNTER — Other Ambulatory Visit (HOSPITAL_BASED_OUTPATIENT_CLINIC_OR_DEPARTMENT_OTHER): Payer: Self-pay

## 2022-11-15 ENCOUNTER — Other Ambulatory Visit (HOSPITAL_BASED_OUTPATIENT_CLINIC_OR_DEPARTMENT_OTHER): Payer: Self-pay

## 2022-12-14 DIAGNOSIS — H524 Presbyopia: Secondary | ICD-10-CM | POA: Diagnosis not present

## 2022-12-15 ENCOUNTER — Encounter: Payer: Self-pay | Admitting: Family Medicine

## 2022-12-15 DIAGNOSIS — E782 Mixed hyperlipidemia: Secondary | ICD-10-CM

## 2022-12-15 DIAGNOSIS — R7303 Prediabetes: Secondary | ICD-10-CM

## 2022-12-15 DIAGNOSIS — I10 Essential (primary) hypertension: Secondary | ICD-10-CM

## 2022-12-29 ENCOUNTER — Other Ambulatory Visit (INDEPENDENT_AMBULATORY_CARE_PROVIDER_SITE_OTHER): Payer: 59

## 2022-12-29 DIAGNOSIS — R7303 Prediabetes: Secondary | ICD-10-CM

## 2022-12-29 DIAGNOSIS — E782 Mixed hyperlipidemia: Secondary | ICD-10-CM

## 2022-12-29 DIAGNOSIS — I10 Essential (primary) hypertension: Secondary | ICD-10-CM

## 2022-12-29 LAB — COMPREHENSIVE METABOLIC PANEL
ALT: 21 U/L (ref 0–35)
AST: 20 U/L (ref 0–37)
Albumin: 4.6 g/dL (ref 3.5–5.2)
Alkaline Phosphatase: 81 U/L (ref 39–117)
BUN: 11 mg/dL (ref 6–23)
CO2: 24 mEq/L (ref 19–32)
Calcium: 9.6 mg/dL (ref 8.4–10.5)
Chloride: 106 mEq/L (ref 96–112)
Creatinine, Ser: 0.78 mg/dL (ref 0.40–1.20)
GFR: 80.88 mL/min (ref >60.00)
Glucose, Bld: 118 mg/dL — ABNORMAL HIGH (ref 70–99)
Potassium: 3.9 mEq/L (ref 3.5–5.1)
Sodium: 142 mEq/L (ref 135–145)
Total Bilirubin: 0.5 mg/dL (ref 0.2–1.2)
Total Protein: 8.1 g/dL (ref 6.0–8.3)

## 2022-12-29 LAB — LIPID PANEL
Cholesterol: 173 mg/dL (ref 0–200)
HDL: 56.7 mg/dL (ref >39.00)
LDL Cholesterol: 98 mg/dL (ref 0–99)
NonHDL: 116.24
Total CHOL/HDL Ratio: 3
Triglycerides: 92 mg/dL (ref 0.0–149.0)
VLDL: 18.4 mg/dL (ref 0.0–40.0)

## 2022-12-29 LAB — HEMOGLOBIN A1C: Hgb A1c MFr Bld: 6.2 % (ref 4.6–6.5)

## 2023-01-04 ENCOUNTER — Other Ambulatory Visit (HOSPITAL_BASED_OUTPATIENT_CLINIC_OR_DEPARTMENT_OTHER): Payer: Self-pay

## 2023-01-04 ENCOUNTER — Ambulatory Visit (INDEPENDENT_AMBULATORY_CARE_PROVIDER_SITE_OTHER): Payer: 59 | Admitting: Family Medicine

## 2023-01-04 VITALS — BP 120/78 | HR 67 | Temp 97.3°F | Ht 63.0 in | Wt 147.6 lb

## 2023-01-04 DIAGNOSIS — I10 Essential (primary) hypertension: Secondary | ICD-10-CM | POA: Diagnosis not present

## 2023-01-04 DIAGNOSIS — R7303 Prediabetes: Secondary | ICD-10-CM

## 2023-01-04 DIAGNOSIS — Z7189 Other specified counseling: Secondary | ICD-10-CM

## 2023-01-04 DIAGNOSIS — E782 Mixed hyperlipidemia: Secondary | ICD-10-CM | POA: Diagnosis not present

## 2023-01-04 DIAGNOSIS — Z0000 Encounter for general adult medical examination without abnormal findings: Secondary | ICD-10-CM

## 2023-01-04 MED ORDER — AMLODIPINE BESYLATE-VALSARTAN 5-160 MG PO TABS
1.0000 | ORAL_TABLET | Freq: Every day | ORAL | 3 refills | Status: DC
  Filled 2023-01-04 – 2023-03-01 (×2): qty 90, 90d supply, fill #0
  Filled 2023-06-01: qty 90, 90d supply, fill #1

## 2023-01-04 NOTE — Assessment & Plan Note (Signed)
Lipids are at goal. Cotninue atorvastatin 20 mg daily and fish oil.

## 2023-01-04 NOTE — Assessment & Plan Note (Signed)
Stable. Discussed importance of regular physical activity and maintaining normal weight in preventing progression to diabetes.

## 2023-01-04 NOTE — Assessment & Plan Note (Signed)
Blood pressure is in good control. Renal function normal. Continue amlodipine-valsartan (Exforge) 5-160 mg daily.

## 2023-01-04 NOTE — Progress Notes (Signed)
[facility]ARE [facility] [address] [place] [address] Dept: [phone] Dept Fax: [phone]  Annual Physical Visit  Subjective:    Patient ID: Joan Morgan, female    DOB: 1961-03-17, 62 y.o..   MRN: [id number]  Chief Complaint  Patient presents with   Annual Exam    CPE/labs.  No concerns.    History of Present Illness:  Patient is in today for an annual physical/preventative visit.  Joan Morgan has hypertension. She is managed on amlodipine-valsartan (Exforge) 5-160 mg daily.    Joan Morgan has a history of prediabetes. Her last A1c in the Spring was 6.2%. She asks about how to treat this better.  Joan Morgan asks about an advanced directive. She has looked at some online, but finds them confusing.  Review of Systems  Constitutional:  Negative for chills, diaphoresis, fever, malaise/fatigue and weight loss.  HENT:  Negative for congestion, ear pain, hearing loss, sinus pain, sore throat and tinnitus.   Eyes:  Negative for blurred vision, pain, discharge and redness.  Respiratory:  Negative for cough, shortness of breath and wheezing.   Cardiovascular:  Negative for chest pain and palpitations.  Gastrointestinal:  Negative for abdominal pain, constipation, diarrhea, heartburn, nausea and vomiting.  Musculoskeletal:  Positive for joint pain. Negative for back pain and myalgias.       Occasional mild left ankle pain. No prior injury.  Skin:  Negative for itching and rash.  Psychiatric/Behavioral:  Negative for depression. The patient is not nervous/anxious.    Past Medical History: Patient Active Problem List   Diagnosis Date Noted   Cataract cortical, senile, bilateral 12/29/2021   Prediabetes 12/31/2019   Chronic pain of both knees 06/20/2019   Constipation 08/16/2017   Hyperlipidemia 10/14/2014   Essential hypertension    Past Surgical History:  Procedure Laterality Date   CYSTOCELE REPAIR N/A  01/21/2020   Procedure: REAPIR ANTERIOR REPAIR (CYSTOCELE) CYSTOSCOPY;  Surgeon: Alfredo Martinez, MD;  Location: [facility];  Service: Urology;  Laterality: N/A;   TOTAL LAPAROSCOPIC HYSTERECTOMY WITH BILATERAL SALPINGO OOPHORECTOMY Bilateral 01/21/2020   Procedure: TOTAL LAPAROSCOPIC HYSTERECTOMY WITH BILATERAL SALPINGO OOPHORECTOMY;  Surgeon: Jerene Bears, MD;  Location: [facility];  Service: Gynecology;  Laterality: Bilateral;   TUBAL LIGATION     VAGINAL PROLAPSE REPAIR N/A 01/21/2020   Procedure: REPAIR VAGINAL VAULT SUSPENSION AND GRIFT;  Surgeon: Alfredo Martinez, MD;  Location: [facility];  Service: Urology;  Laterality: N/A;   Family History  Problem Relation Age of Onset   Hyperlipidemia Mother    Hypertension Mother    Aneurysm Mother    Colon polyps Father    Hyperlipidemia Father    Breast cancer Neg Hx    Colon cancer Neg Hx    Esophageal cancer Neg Hx    Stomach cancer Neg Hx    Rectal cancer Neg Hx    Outpatient Medications Prior to Visit  Medication Sig Dispense Refill   atorvastatin (LIPITOR) 20 MG tablet Take 1 tablet (20 mg total) by mouth every other day. 45 tablet 3   cholecalciferol (VITAMIN D3) 25 MCG (1000 UNIT) tablet Take 1,000 Units by mouth daily.     magnesium 30 MG tablet Take 30 mg by mouth 2 (two) times daily.     Multiple Vitamin (MULTIVITAMIN) tablet Take 1 tablet by mouth daily.     Omega-3 Fatty Acids (FISH OIL) 1000 MG CAPS Take by mouth.     amLODipine-valsartan (EXFORGE) 5-160 MG tablet Take 1 tablet by  mouth daily. 90 tablet 3   No facility-administered medications prior to visit.   Allergies  Allergen Reactions   Ace Inhibitors     cough   Objective:   Today's Vitals   01/04/23 0825  BP: 120/78  Pulse: 67  Temp: (!) 97.3 F (36.3 C)  TempSrc: Temporal  SpO2: 97%  Weight: 147 lb 9.6 oz (67 kg)  Height: 5\' 3"  (1.6 m)   Body mass index is 26.15 kg/m.   General: Well developed, well nourished. No acute distress. HEENT: Normocephalic,  non-traumatic. PERRL, EOMI. Conjunctiva clear. External ears normal. EAC   and TMs normal bilaterally. Nose clear without congestion or rhinorrhea. Mucous membranes moist.   Oropharynx clear. Good dentition. Neck: Supple. No lymphadenopathy. No thyromegaly. Lungs: Clear to auscultation bilaterally. No wheezing, rales or rhonchi. CV: RRR without murmurs or rubs. Pulses 2+ bilaterally. Abdomen: Soft, non-tender. Bowel sounds positive, normal pitch and frequency. No   hepatosplenomegaly. No rebound or guarding. Extremities: Full ROM. No joint swelling or tenderness. Mild crepitance in right shoulder. Left ankle   normal. No edema noted. Skin: Warm and dry. No rashes. Psych: Alert and oriented. Normal mood and affect.  There are no preventive care reminders to display for this patient.    Lab Results: Lab Results  Component Value Date   CHOL 173 12/29/2022   HDL 56.70 12/29/2022   LDLCALC 98 12/29/2022   LDLDIRECT 159.7 10/24/2012   TRIG 92.0 12/29/2022   CHOLHDL 3 12/29/2022   Lab Results  Component Value Date   HGBA1C 6.2 12/29/2022      Latest Ref Rng & Units 12/29/2022    8:16 AM 12/29/2021    9:31 AM 06/04/2021    8:25 AM  BMP  Glucose 70 - 99 mg/dL 161  096  045   BUN 6 - 23 mg/dL 11  12  13    Creatinine 0.40 - 1.20 mg/dL 4.09  8.11  9.14   Sodium 135 - 145 mEq/L 142  139  143   Potassium 3.5 - 5.1 mEq/L 3.9  3.6  3.8   Chloride 96 - 112 mEq/L 106  100  107   CO2 19 - 32 mEq/L 24  29  30    Calcium 8.4 - 10.5 mg/dL 9.6  78.2  9.5     Assessment & Plan:   Problem List Items Addressed This Visit       Cardiovascular and Mediastinum   Essential hypertension    Blood pressure is in good control. Renal function normal. Continue amlodipine-valsartan (Exforge) 5-160 mg daily.      Relevant Medications   amLODipine-valsartan (EXFORGE) 5-160 MG tablet     Other   Hyperlipidemia    Lipids are at goal. Cotninue atorvastatin 20 mg daily and fish oil.      Relevant  Medications   amLODipine-valsartan (EXFORGE) 5-160 MG tablet   Prediabetes    Stable. Discussed importance of regular physical activity and maintaining normal weight in preventing progression to diabetes.      Other Visit Diagnoses     Annual physical exam    -  Primary   Overall health is excellent. UTD on recommended screenings. She prefers to not get the zoster vaccine.   Counseling regarding advanced directives       Provided her with a [place] Advanced Directive. I reviewed the secitons and discussed her appointing a Health Care Respresentative.       Return in about 6 months (around 07/06/2023) for Reassessment.   Jeannett Senior  Kerri Perches, MD

## 2023-01-26 ENCOUNTER — Other Ambulatory Visit (HOSPITAL_BASED_OUTPATIENT_CLINIC_OR_DEPARTMENT_OTHER): Payer: Self-pay

## 2023-02-10 ENCOUNTER — Other Ambulatory Visit: Payer: Self-pay | Admitting: Oncology

## 2023-02-10 DIAGNOSIS — Z006 Encounter for examination for normal comparison and control in clinical research program: Secondary | ICD-10-CM

## 2023-03-02 ENCOUNTER — Other Ambulatory Visit (HOSPITAL_BASED_OUTPATIENT_CLINIC_OR_DEPARTMENT_OTHER): Payer: Self-pay

## 2023-03-27 ENCOUNTER — Encounter: Payer: Self-pay | Admitting: Family Medicine

## 2023-05-22 ENCOUNTER — Other Ambulatory Visit (HOSPITAL_BASED_OUTPATIENT_CLINIC_OR_DEPARTMENT_OTHER): Payer: Self-pay

## 2023-05-22 MED ORDER — FLULAVAL 0.5 ML IM SUSY
0.5000 mL | PREFILLED_SYRINGE | Freq: Once | INTRAMUSCULAR | 0 refills | Status: AC
  Filled 2023-05-22: qty 0.5, 1d supply, fill #0

## 2023-06-01 ENCOUNTER — Other Ambulatory Visit (HOSPITAL_BASED_OUTPATIENT_CLINIC_OR_DEPARTMENT_OTHER): Payer: Self-pay

## 2023-06-01 ENCOUNTER — Other Ambulatory Visit: Payer: Self-pay

## 2023-06-03 ENCOUNTER — Other Ambulatory Visit
Admission: RE | Admit: 2023-06-03 | Discharge: 2023-06-03 | Disposition: A | Discharge status: Home / Self Care | Payer: 59 | Source: Ambulatory Visit | Attending: Oncology | Admitting: Oncology

## 2023-06-03 DIAGNOSIS — Z006 Encounter for examination for normal comparison and control in clinical research program: Secondary | ICD-10-CM | POA: Insufficient documentation

## 2023-06-14 LAB — HELIX MOLECULAR SCREEN: Genetic Analysis Overall Interpretation: NEGATIVE

## 2023-06-14 LAB — GENECONNECT MOLECULAR SCREEN

## 2023-07-05 ENCOUNTER — Ambulatory Visit: Payer: 59 | Admitting: Family Medicine

## 2023-07-12 ENCOUNTER — Ambulatory Visit: Payer: 59 | Admitting: Family Medicine

## 2023-07-12 ENCOUNTER — Other Ambulatory Visit (HOSPITAL_BASED_OUTPATIENT_CLINIC_OR_DEPARTMENT_OTHER): Payer: Self-pay

## 2023-07-12 ENCOUNTER — Encounter: Payer: Self-pay | Admitting: Family Medicine

## 2023-07-12 VITALS — BP 118/70 | HR 72 | Temp 97.6°F | Ht 63.0 in | Wt 143.0 lb

## 2023-07-12 DIAGNOSIS — E782 Mixed hyperlipidemia: Secondary | ICD-10-CM | POA: Diagnosis not present

## 2023-07-12 DIAGNOSIS — I10 Essential (primary) hypertension: Secondary | ICD-10-CM

## 2023-07-12 DIAGNOSIS — U071 COVID-19: Secondary | ICD-10-CM | POA: Insufficient documentation

## 2023-07-12 MED ORDER — VALSARTAN 160 MG PO TABS
160.0000 mg | ORAL_TABLET | Freq: Every day | ORAL | 3 refills | Status: DC
  Filled 2023-07-12: qty 90, 90d supply, fill #0

## 2023-07-12 NOTE — Progress Notes (Signed)
[facility]ARE [facility] [address] [place] [address] Dept: [phone] Dept Fax: [phone]  Chronic Care Office Visit  Subjective:    Patient ID: Joan Morgan, female    DOB: [birth year], 62 y.o..   MRN: [id number]  Chief Complaint  Patient presents with   Hypertension    3 month f/u HTN.,  no concerns.    History of Present Illness:  Patient is in today for reassessment of chronic medical issues.  Joan Morgan has hypertension. She is managed on amlodipine-valsartan (Exforge) 5-160 mg daily. SAhe notes at the time that we switched to this combination, she was under more stress. This has improved. She asks about stepping her therapy back down.  Joan Morgan has a history of hyperlipidemia. She is managed on atorvastatin 20 mg daily.  Joan Morgan returned from a trip to [place] over the weekend. By Monday, she had started to have some headache and sore throat. She tested positive for COVID. She is staying home from work currently.  Past Medical History: Patient Active Problem List   Diagnosis Date Noted   Cataract cortical, senile, bilateral 12/29/2021   Prediabetes 12/31/2019   Chronic pain of both knees 06/20/2019   Constipation 08/16/2017   Hyperlipidemia 10/14/2014   Essential hypertension    Past Surgical History:  Procedure Laterality Date   CYSTOCELE REPAIR N/A 01/21/2020   Procedure: REAPIR ANTERIOR REPAIR (CYSTOCELE) CYSTOSCOPY;  Surgeon: Alfredo Martinez, MD;  Location: [facility];  Service: Urology;  Laterality: N/A;   TOTAL LAPAROSCOPIC HYSTERECTOMY WITH BILATERAL SALPINGO OOPHORECTOMY Bilateral 01/21/2020   Procedure: TOTAL LAPAROSCOPIC HYSTERECTOMY WITH BILATERAL SALPINGO OOPHORECTOMY;  Surgeon: Jerene Bears, MD;  Location: [facility];  Service: Gynecology;  Laterality: Bilateral;   TUBAL LIGATION     VAGINAL PROLAPSE REPAIR N/A 01/21/2020   Procedure: REPAIR VAGINAL VAULT SUSPENSION AND GRIFT;   Surgeon: Alfredo Martinez, MD;  Location: [facility];  Service: Urology;  Laterality: N/A;   Family History  Problem Relation Age of Onset   Hyperlipidemia Mother    Hypertension Mother    Aneurysm Mother    Colon polyps Father    Hyperlipidemia Father    Breast cancer Neg Hx    Colon cancer Neg Hx    Esophageal cancer Neg Hx    Stomach cancer Neg Hx    Rectal cancer Neg Hx    Outpatient Medications Prior to Visit  Medication Sig Dispense Refill   atorvastatin (LIPITOR) 20 MG tablet Take 1 tablet (20 mg total) by mouth every other day. 45 tablet 3   cholecalciferol (VITAMIN D3) 25 MCG (1000 UNIT) tablet Take 1,000 Units by mouth daily.     magnesium 30 MG tablet Take 30 mg by mouth 2 (two) times daily.     Multiple Vitamin (MULTIVITAMIN) tablet Take 1 tablet by mouth daily.     Omega-3 Fatty Acids (FISH OIL) 1000 MG CAPS Take by mouth.     amLODipine-valsartan (EXFORGE) 5-160 MG tablet Take 1 tablet by mouth daily. 90 tablet 3   No facility-administered medications prior to visit.   Allergies  Allergen Reactions   Ace Inhibitors     cough   Objective:   Today's Vitals   07/12/23 0801  BP: 118/70  Pulse: 72  Temp: 97.6 F (36.4 C)  TempSrc: Temporal  SpO2: 98%  Weight: 143 lb (64.9 kg)  Height: 5\' 3"  (1.6 m)   Body mass index is 25.33 kg/m.   General: Well developed, well nourished. No acute distress.  Psych: Alert and oriented. Normal mood and affect.  Health Maintenance Due  Topic Date Due   Zoster Vaccines- Shingrix (1 of 2) Never done     Assessment & Plan:   Problem List Items Addressed This Visit       Cardiovascular and Mediastinum   Essential hypertension - Primary    Blood pressure is in good control.I will switch her from Exforge to monotherapy with valsartan 160 mg daily.      Relevant Medications   valsartan (DIOVAN) 160 MG tablet     Other   COVID-19    Reviewed home care instructions for COVID, including rest, pushing fluids, and OTC  medications as needed for symptom relief. Recommend hot tea with honey for sore throat symptoms. Advised self-isolation at home until 24 hours after fever resolved and symptoms improve. Continue to wear a mask around others for an additional 5 days. If symptoms, esp, dyspnea develops/worsens, recommend in-person evaluation at either an urgent care or the emergency room.       Hyperlipidemia    Lipids are at goal. Cotninue atorvastatin 20 mg daily and fish oil.      Relevant Medications   valsartan (DIOVAN) 160 MG tablet    Return in about 6 months (around 01/10/2024) for Reassessment.   Loyola Mast, MD

## 2023-07-12 NOTE — Assessment & Plan Note (Signed)
Reviewed home care instructions for COVID, including rest, pushing fluids, and OTC medications as needed for symptom relief. Recommend hot tea with honey for sore throat symptoms. Advised self-isolation at home until 24 hours after fever resolved and symptoms improve. Continue to wear a mask around others for an additional 5 days. If symptoms, esp, dyspnea develops/worsens, recommend in-person evaluation at either an urgent care or the emergency room.

## 2023-07-12 NOTE — Assessment & Plan Note (Signed)
Lipids are at goal. Cotninue atorvastatin 20 mg daily and fish oil.

## 2023-07-12 NOTE — Assessment & Plan Note (Signed)
Blood pressure is in good control.I will switch her from Exforge to monotherapy with valsartan 160 mg daily.

## 2023-07-19 ENCOUNTER — Other Ambulatory Visit (HOSPITAL_BASED_OUTPATIENT_CLINIC_OR_DEPARTMENT_OTHER): Payer: Self-pay

## 2023-08-23 ENCOUNTER — Telehealth: Payer: Self-pay | Admitting: Family Medicine

## 2023-08-23 NOTE — Telephone Encounter (Signed)
 ERROR

## 2023-08-25 ENCOUNTER — Telehealth: Payer: Self-pay | Admitting: Family Medicine

## 2023-08-25 NOTE — Telephone Encounter (Signed)
error

## 2023-10-18 ENCOUNTER — Other Ambulatory Visit: Payer: Self-pay | Admitting: Family Medicine

## 2023-10-18 ENCOUNTER — Other Ambulatory Visit (HOSPITAL_BASED_OUTPATIENT_CLINIC_OR_DEPARTMENT_OTHER): Payer: Self-pay

## 2023-10-18 ENCOUNTER — Encounter: Payer: Self-pay | Admitting: Family Medicine

## 2023-10-18 DIAGNOSIS — I10 Essential (primary) hypertension: Secondary | ICD-10-CM

## 2023-10-19 ENCOUNTER — Other Ambulatory Visit (HOSPITAL_BASED_OUTPATIENT_CLINIC_OR_DEPARTMENT_OTHER): Payer: Self-pay

## 2023-10-19 MED ORDER — AMLODIPINE BESYLATE-VALSARTAN 5-160 MG PO TABS
1.0000 | ORAL_TABLET | Freq: Every day | ORAL | 3 refills | Status: DC
  Filled 2023-10-19: qty 90, 90d supply, fill #0

## 2023-10-21 ENCOUNTER — Other Ambulatory Visit (HOSPITAL_BASED_OUTPATIENT_CLINIC_OR_DEPARTMENT_OTHER): Payer: Self-pay

## 2023-12-05 ENCOUNTER — Other Ambulatory Visit (HOSPITAL_BASED_OUTPATIENT_CLINIC_OR_DEPARTMENT_OTHER): Payer: Self-pay

## 2023-12-05 ENCOUNTER — Other Ambulatory Visit: Payer: Self-pay | Admitting: Family Medicine

## 2023-12-05 DIAGNOSIS — E782 Mixed hyperlipidemia: Secondary | ICD-10-CM

## 2023-12-05 MED ORDER — ATORVASTATIN CALCIUM 20 MG PO TABS
20.0000 mg | ORAL_TABLET | ORAL | 3 refills | Status: DC
  Filled 2023-12-05: qty 45, 90d supply, fill #0

## 2023-12-11 ENCOUNTER — Other Ambulatory Visit: Payer: Self-pay | Admitting: Family Medicine

## 2023-12-11 DIAGNOSIS — Z1231 Encounter for screening mammogram for malignant neoplasm of breast: Secondary | ICD-10-CM

## 2023-12-15 ENCOUNTER — Ambulatory Visit: Payer: Self-pay | Admitting: Urgent Care

## 2023-12-15 ENCOUNTER — Other Ambulatory Visit (HOSPITAL_BASED_OUTPATIENT_CLINIC_OR_DEPARTMENT_OTHER): Payer: Self-pay

## 2023-12-15 ENCOUNTER — Ambulatory Visit
Admission: EM | Admit: 2023-12-15 | Discharge: 2023-12-15 | Disposition: A | Discharge status: Home / Self Care | Attending: Family Medicine | Admitting: Family Medicine

## 2023-12-15 ENCOUNTER — Ambulatory Visit: Admitting: Radiology

## 2023-12-15 DIAGNOSIS — S29011A Strain of muscle and tendon of front wall of thorax, initial encounter: Secondary | ICD-10-CM

## 2023-12-15 DIAGNOSIS — R0781 Pleurodynia: Secondary | ICD-10-CM | POA: Diagnosis not present

## 2023-12-15 DIAGNOSIS — R0789 Other chest pain: Secondary | ICD-10-CM | POA: Diagnosis not present

## 2023-12-15 MED ORDER — NAPROXEN 500 MG PO TABS
500.0000 mg | ORAL_TABLET | Freq: Two times a day (BID) | ORAL | 0 refills | Status: AC
  Filled 2023-12-15: qty 30, 15d supply, fill #0

## 2023-12-15 MED ORDER — CYCLOBENZAPRINE HCL 5 MG PO TABS
5.0000 mg | ORAL_TABLET | Freq: Every evening | ORAL | 0 refills | Status: AC | PRN
  Filled 2023-12-15: qty 30, 30d supply, fill #0

## 2023-12-15 NOTE — ED Provider Notes (Signed)
 [facility]  Note:  This document was prepared using Conservation officer, historic buildings and may include unintentional dictation errors.  MRN: [id number] DOB: August 09, 1960  Subjective:   Joan Morgan is a 63 y.o. female presenting for 2-day history of persistent moderate to severe left anterior lower chest wall pain.  No fall, trauma.  No fever, cough, shortness of breath or wheezing.  No history of respiratory disorders.  The only possible inciting factor that the patient can think of is having done a strenuous workout.  She has since been using a brace to compress her chest wall.  Has also been using muscle rubs with minimal relief.  No current facility-administered medications for this encounter.  Current Outpatient Medications:    amLODipine -valsartan  (EXFORGE ) 5-160 MG tablet, Take 1 tablet by mouth daily., Disp: 90 tablet, Rfl: 3   atorvastatin  (LIPITOR) 20 MG tablet, Take 1 tablet (20 mg total) by mouth every other day., Disp: 45 tablet, Rfl: 3   cholecalciferol (VITAMIN D3) 25 MCG (1000 UNIT) tablet, Take 1,000 Units by mouth daily., Disp: , Rfl:    magnesium 30 MG tablet, Take 30 mg by mouth 2 (two) times daily., Disp: , Rfl:    Multiple Vitamin (MULTIVITAMIN) tablet, Take 1 tablet by mouth daily., Disp: , Rfl:    Omega-3 Fatty Acids (FISH OIL) 1000 MG CAPS, Take by mouth., Disp: , Rfl:    Allergies  Allergen Reactions   Ace Inhibitors     cough    Past Medical History:  Diagnosis Date   Anemia    Hyperlipidemia    Hypertension    Pre-diabetes      Past Surgical History:  Procedure Laterality Date   CYSTOCELE REPAIR N/A 01/21/2020   Procedure: REAPIR ANTERIOR REPAIR (CYSTOCELE) CYSTOSCOPY;  Surgeon: Erman Hayward, MD;  Location: [facility];  Service: Urology;  Laterality: N/A;   TOTAL LAPAROSCOPIC HYSTERECTOMY WITH BILATERAL SALPINGO OOPHORECTOMY Bilateral 01/21/2020   Procedure: TOTAL LAPAROSCOPIC HYSTERECTOMY WITH BILATERAL SALPINGO  OOPHORECTOMY;  Surgeon: Lillian Rein, MD;  Location: [facility];  Service: Gynecology;  Laterality: Bilateral;   TUBAL LIGATION     VAGINAL PROLAPSE REPAIR N/A 01/21/2020   Procedure: REPAIR VAGINAL VAULT SUSPENSION AND GRIFT;  Surgeon: Erman Hayward, MD;  Location: [facility];  Service: Urology;  Laterality: N/A;    Family History  Problem Relation Age of Onset   Hyperlipidemia Mother    Hypertension Mother    Aneurysm Mother    Colon polyps Father    Hyperlipidemia Father    Breast cancer Neg Hx    Colon cancer Neg Hx    Esophageal cancer Neg Hx    Stomach cancer Neg Hx    Rectal cancer Neg Hx     Social History   Tobacco Use   Smoking status: Never   Smokeless tobacco: Never  Vaping Use   Vaping status: Never Used  Substance Use Topics   Alcohol use: No   Drug use: No    ROS   Objective:   Vitals: BP 115/78 (BP Location: Right Arm)   Pulse 80   Temp 98.1 F (36.7 C) (Oral)   Resp 16   Ht 5\' 2"  (1.575 m)   Wt 148 lb (67.1 kg)   LMP 08/01/2009 (Approximate)   SpO2 95%   BMI 27.07 kg/m   Physical Exam Constitutional:      General: She is not in acute distress.    Appearance: Normal appearance. She is well-developed. She is not ill-appearing,  toxic-appearing or diaphoretic.  HENT:     Head: Normocephalic and atraumatic.     Nose: Nose normal.     Mouth/Throat:     Mouth: Mucous membranes are moist.  Eyes:     General: No scleral icterus.       Right eye: No discharge.        Left eye: No discharge.     Extraocular Movements: Extraocular movements intact.  Cardiovascular:     Rate and Rhythm: Normal rate and regular rhythm.     Heart sounds: Normal heart sounds. No murmur heard.    No friction rub. No gallop.  Pulmonary:     Effort: Pulmonary effort is normal. No respiratory distress.     Breath sounds: No stridor. No wheezing, rhonchi or rales.  Chest:     Chest wall: Tenderness (over area outlined) present.    Skin:    General: Skin is warm and  dry.  Neurological:     General: No focal deficit present.     Mental Status: She is alert and oriented to person, place, and time.  Psychiatric:        Mood and Affect: Mood normal.        Behavior: Behavior normal.     Assessment and Plan :   PDMP not reviewed this encounter.  1. Chest wall muscle strain, initial encounter   2. Rib pain on left side   3. Anterior chest wall pain    X-ray over-read was pending at time of discharge, recommended follow up with only abnormal results. Otherwise will not call for negative over-read. Patient was in agreement.  Recommended managing for chest wall strain with naproxen and cyclobenzaprine.  Counseled patient on potential for adverse effects with medications prescribed/recommended today, ER and return-to-clinic precautions discussed, patient verbalized understanding.    Adolph Hoop, [place] 12/15/23 1354

## 2023-12-15 NOTE — ED Triage Notes (Signed)
 Patient presents to UC for left sided rib pain x 2 days. Denies respiratory symptoms or injury. Not taking anything for pain. Has been wearing brace to help with support and muscle rubs.

## 2024-01-10 ENCOUNTER — Encounter: Payer: 59 | Admitting: Family Medicine

## 2024-01-11 ENCOUNTER — Ambulatory Visit (INDEPENDENT_AMBULATORY_CARE_PROVIDER_SITE_OTHER): Payer: 59 | Admitting: Family Medicine

## 2024-01-11 ENCOUNTER — Encounter: Payer: Self-pay | Admitting: Family Medicine

## 2024-01-11 ENCOUNTER — Other Ambulatory Visit (HOSPITAL_BASED_OUTPATIENT_CLINIC_OR_DEPARTMENT_OTHER): Payer: Self-pay

## 2024-01-11 VITALS — BP 132/70 | HR 62 | Temp 97.4°F | Ht 62.0 in | Wt 150.6 lb

## 2024-01-11 DIAGNOSIS — M79604 Pain in right leg: Secondary | ICD-10-CM

## 2024-01-11 DIAGNOSIS — I10 Essential (primary) hypertension: Secondary | ICD-10-CM | POA: Diagnosis not present

## 2024-01-11 DIAGNOSIS — L821 Other seborrheic keratosis: Secondary | ICD-10-CM | POA: Diagnosis not present

## 2024-01-11 DIAGNOSIS — R252 Cramp and spasm: Secondary | ICD-10-CM | POA: Diagnosis not present

## 2024-01-11 DIAGNOSIS — Z0000 Encounter for general adult medical examination without abnormal findings: Secondary | ICD-10-CM

## 2024-01-11 DIAGNOSIS — R7303 Prediabetes: Secondary | ICD-10-CM

## 2024-01-11 DIAGNOSIS — E782 Mixed hyperlipidemia: Secondary | ICD-10-CM | POA: Diagnosis not present

## 2024-01-11 LAB — LIPID PANEL
Cholesterol: 185 mg/dL (ref 0–200)
HDL: 52.7 mg/dL (ref >39.00)
LDL Cholesterol: 102 mg/dL — ABNORMAL HIGH (ref 0–99)
NonHDL: 132.48
Total CHOL/HDL Ratio: 4
Triglycerides: 151 mg/dL — ABNORMAL HIGH (ref 0.0–149.0)
VLDL: 30.2 mg/dL (ref 0.0–40.0)

## 2024-01-11 LAB — BASIC METABOLIC PANEL WITH GFR
BUN: 10 mg/dL (ref 6–23)
CO2: 29 meq/L (ref 19–32)
Calcium: 10.1 mg/dL (ref 8.4–10.5)
Chloride: 103 meq/L (ref 96–112)
Creatinine, Ser: 0.77 mg/dL (ref 0.40–1.20)
GFR: 81.54 mL/min (ref >60.00)
Glucose, Bld: 107 mg/dL — ABNORMAL HIGH (ref 70–99)
Potassium: 4 meq/L (ref 3.5–5.1)
Sodium: 140 meq/L (ref 135–145)

## 2024-01-11 LAB — MAGNESIUM: Magnesium: 2 mg/dL (ref 1.5–2.5)

## 2024-01-11 LAB — HEMOGLOBIN A1C: Hgb A1c MFr Bld: 6.4 % (ref 4.6–6.5)

## 2024-01-11 LAB — URIC ACID: Uric Acid, Serum: 6.2 mg/dL (ref 2.4–7.0)

## 2024-01-11 MED ORDER — AMLODIPINE BESYLATE 5 MG PO TABS
5.0000 mg | ORAL_TABLET | Freq: Every day | ORAL | 3 refills | Status: AC
  Filled 2024-01-11: qty 90, 90d supply, fill #0
  Filled 2024-04-10: qty 90, 90d supply, fill #1
  Filled 2024-07-08: qty 90, 90d supply, fill #2

## 2024-01-11 MED ORDER — VALSARTAN 80 MG PO TABS
80.0000 mg | ORAL_TABLET | Freq: Every day | ORAL | 3 refills | Status: AC
  Filled 2024-01-11: qty 90, 90d supply, fill #0
  Filled 2024-04-10: qty 90, 90d supply, fill #1
  Filled 2024-07-08: qty 90, 90d supply, fill #2

## 2024-01-11 NOTE — Patient Instructions (Signed)
 nd

## 2024-01-11 NOTE — Assessment & Plan Note (Signed)
 Overall health is good. Recommend ongoing regular exercise. Discussed recommended screenings and immunizations.

## 2024-01-11 NOTE — Progress Notes (Signed)
 [facility] Joan Morgan [facility] [address] [place] [address] Dept: [phone] Dept Fax: [phone]  Annual Physical Visit  Subjective:    Patient ID: Joan Morgan, female    DOB: 05-09-1961, 63 y.o..   MRN: [id number]  Chief Complaint  Patient presents with   Annual Exam    CPE/labs.  Fasting today.  C/o having ankle swelling   History of Present Illness:  Patient is in today for an annual physical/preventative visit.  [name] has hypertension. She is managed on amlodipine -valsartan  (Exforge ) 5-160 mg daily. We gave a trial last Dec. of having her only on valsartan , but she noted her BP was going back up. She is still interested in trying to reduce some of her dosage, so asks about a lower valsartan  dose.   [name] has a history of hyperlipidemia. She is managed on atorvastatin  20 mg daily.  Review of Systems  Constitutional:  Negative for chills, diaphoresis, fever, malaise/fatigue and weight loss.  HENT:  Positive for congestion. Negative for ear pain, hearing loss, sinus pain, sore throat and tinnitus.        Mild allergy symptoms.  Eyes:  Negative for blurred vision, pain, discharge and redness.  Respiratory:  Negative for cough, shortness of breath and wheezing.   Cardiovascular:  Positive for chest pain. Negative for palpitations.       Had an episode last week of pain over the left lower costal margin. She was seen at [facility] and evaluated. No specific cause was found. This has resolved at this point.  Gastrointestinal:  Negative for abdominal pain, constipation, diarrhea, heartburn, nausea and vomiting.  Musculoskeletal:  Negative for back pain, joint pain and myalgias.       Had an episode last week of swelling over the right lower leg, medially, just above the ankle joint. She has had some periodic leg cramps. She has never had gout, but worries about this.  Skin:  Positive for rash. Negative for itching.        Notes a lesion on her left temple.  Psychiatric/Behavioral:  Negative for depression. The patient is not nervous/anxious.    Past Medical History: Patient Active Problem List   Diagnosis Date Noted   Cataract cortical, senile, bilateral 12/29/2021   Prediabetes 12/31/2019   Chronic pain of both knees 06/20/2019   Constipation 08/16/2017   Hyperlipidemia 10/14/2014   Essential hypertension    Past Surgical History:  Procedure Laterality Date   ABDOMINAL HYSTERECTOMY     CYSTOCELE REPAIR N/A 01/21/2020   Procedure: REAPIR ANTERIOR REPAIR (CYSTOCELE) CYSTOSCOPY;  Surgeon: Erman Hayward, MD;  Location: [facility];  Service: Urology;  Laterality: N/A;   TOTAL LAPAROSCOPIC HYSTERECTOMY WITH BILATERAL SALPINGO OOPHORECTOMY Bilateral 01/21/2020   Procedure: TOTAL LAPAROSCOPIC HYSTERECTOMY WITH BILATERAL SALPINGO OOPHORECTOMY;  Surgeon: Lillian Rein, MD;  Location: [facility];  Service: Gynecology;  Laterality: Bilateral;   TUBAL LIGATION     VAGINAL PROLAPSE REPAIR N/A 01/21/2020   Procedure: REPAIR VAGINAL VAULT SUSPENSION AND GRIFT;  Surgeon: Erman Hayward, MD;  Location: [facility];  Service: Urology;  Laterality: N/A;   Family History  Problem Relation Age of Onset   Hyperlipidemia Mother    Hypertension Mother    Aneurysm Mother    Colon polyps Father    Hyperlipidemia Father    Breast cancer Neg Hx    Colon cancer Neg Hx    Esophageal cancer Neg Hx    Stomach cancer Neg Hx    Rectal cancer Neg  Hx    Outpatient Medications Prior to Visit  Medication Sig Dispense Refill   atorvastatin  (LIPITOR) 20 MG tablet Take 1 tablet (20 mg total) by mouth every other day. 45 tablet 3   cholecalciferol (VITAMIN D3) 25 MCG (1000 UNIT) tablet Take 1,000 Units by mouth daily.     cyclobenzaprine  (FLEXERIL ) 5 MG tablet Take 1 tablet (5 mg total) by mouth at bedtime as needed for muscle spasms. 30 tablet 0   magnesium 30 MG tablet Take 30 mg by mouth 2 (two) times daily.     Multiple Vitamin  (MULTIVITAMIN) tablet Take 1 tablet by mouth daily.     naproxen  (NAPROSYN ) 500 MG tablet Take 1 tablet (500 mg total) by mouth 2 (two) times daily with a meal. 30 tablet 0   Omega-3 Fatty Acids (FISH OIL) 1000 MG CAPS Take by mouth.     amLODipine -valsartan  (EXFORGE ) 5-160 MG tablet Take 1 tablet by mouth daily. 90 tablet 3   No facility-administered medications prior to visit.   Allergies  Allergen Reactions   Ace Inhibitors     cough   Objective:   Today's Vitals   01/11/24 0859  BP: 132/70  Pulse: 62  Temp: (!) 97.4 F (36.3 C)  TempSrc: Temporal  SpO2: 98%  Weight: 150 lb 9.6 oz (68.3 kg)  Height: 5' 2 (1.575 m)   Body mass index is 27.55 kg/m.   General: Well developed, well nourished. No acute distress. HEENT: Normocephalic, non-traumatic. PERRL, EOMI. Conjunctiva clear. External ears normal. EAC and TMs normal   bilaterally. Nose clear without congestion or rhinorrhea. Mucous membranes moist. Oropharynx clear. Good dentition. Neck: Supple. No lymphadenopathy. No thyromegaly. Lungs: Clear to auscultation bilaterally. No wheezing, rales or rhonchi. CV: RRR without murmurs or rubs. Pulses 2+ bilaterally. Abdomen: Soft, non-tender. Bowel sounds positive, normal pitch and frequency. No hepatosplenomegaly. No rebound or   guarding. Extremities: Full ROM. No joint swelling or tenderness. No edema noted. Area above medial malleolus where swelling was   noted shows some varicose veins, but no sing of inflammation. Skin: Warm and dry. There is an oval patch of brownish hyperpigmented skin with a velvety surface. Psych: Alert and oriented. Normal mood and affect.  Health Maintenance Due  Topic Date Due   Zoster Vaccines- Shingrix (1 of 2) Never done     Assessment & Plan:   Problem List Items Addressed This Visit       Cardiovascular and Mediastinum   Essential hypertension   Blood pressure is in adequate control. I will switch her from Exforge  to amlodipine  5 mg and  valsartan  80 mg daily. I recommend she monitor her Bp at home. If this goes up, we may have to increase the valsartan  back up. I will check her annual renal labs today.      Relevant Medications   amLODipine  (NORVASC ) 5 MG tablet   valsartan  (DIOVAN ) 80 MG tablet   Other Relevant Orders   Basic metabolic panel with GFR     Musculoskeletal and Integument   Seborrheic keratosis   Benign. Reassured her. Offered dermatology referral for cryotherapy, but she declined.        Other   Annual physical exam - Primary   Overall health is good. Recommend ongoing regular exercise. Discussed recommended screenings and immunizations.       Hyperlipidemia   I will reassess lipids. Cotninue atorvastatin  20 mg daily and fish oil.      Relevant Medications   amLODipine  (NORVASC ) 5 MG tablet  valsartan  (DIOVAN ) 80 MG tablet   Other Relevant Orders   Lipid panel   Prediabetes   I will check annual A1c and glucose.      Relevant Orders   Basic metabolic panel with GFR   Hemoglobin A1c   Other Visit Diagnoses       Right leg pain       May have been an episode of phlebitis. I will check a uric acid, due to concerns for possible gout.   Relevant Orders   Uric acid     Muscle cramps       I will check a magnesium, calcium , and electrolytes.   Relevant Orders   Basic metabolic panel with GFR   Magnesium       Return in about 6 months (around 07/12/2024) for Reassessment.   Graig Lawyer, MD

## 2024-01-11 NOTE — Assessment & Plan Note (Signed)
 I will check annual A1c and glucose.

## 2024-01-11 NOTE — Assessment & Plan Note (Signed)
 Benign. Reassured her. Offered dermatology referral for cryotherapy, but she declined.

## 2024-01-11 NOTE — Assessment & Plan Note (Signed)
 I will reassess lipids. Cotninue atorvastatin  20 mg daily and fish oil.

## 2024-01-11 NOTE — Assessment & Plan Note (Signed)
 Blood pressure is in adequate control. I will switch her from Exforge  to amlodipine  5 mg and valsartan  80 mg daily. I recommend she monitor her Bp at home. If this goes up, we may have to increase the valsartan  back up. I will check her annual renal labs today.

## 2024-01-12 ENCOUNTER — Other Ambulatory Visit (HOSPITAL_BASED_OUTPATIENT_CLINIC_OR_DEPARTMENT_OTHER): Payer: Self-pay

## 2024-01-12 ENCOUNTER — Ambulatory Visit: Payer: Self-pay | Admitting: Family Medicine

## 2024-01-12 DIAGNOSIS — E782 Mixed hyperlipidemia: Secondary | ICD-10-CM

## 2024-01-12 MED ORDER — ATORVASTATIN CALCIUM 40 MG PO TABS
40.0000 mg | ORAL_TABLET | ORAL | 3 refills | Status: AC
  Filled 2024-01-12: qty 45, 90d supply, fill #0
  Filled 2024-04-10: qty 45, 90d supply, fill #1
  Filled 2024-07-08: qty 45, 90d supply, fill #2

## 2024-02-15 ENCOUNTER — Ambulatory Visit

## 2024-02-29 ENCOUNTER — Ambulatory Visit

## 2024-03-14 ENCOUNTER — Ambulatory Visit
Admission: RE | Admit: 2024-03-14 | Discharge: 2024-03-14 | Disposition: A | Discharge status: Home / Self Care | Source: Ambulatory Visit | Attending: Family Medicine | Admitting: Family Medicine

## 2024-03-14 DIAGNOSIS — Z1231 Encounter for screening mammogram for malignant neoplasm of breast: Secondary | ICD-10-CM | POA: Diagnosis not present

## 2024-03-15 ENCOUNTER — Encounter: Payer: Self-pay | Admitting: Family Medicine

## 2024-03-20 DIAGNOSIS — H524 Presbyopia: Secondary | ICD-10-CM | POA: Diagnosis not present

## 2024-04-23 ENCOUNTER — Other Ambulatory Visit (HOSPITAL_BASED_OUTPATIENT_CLINIC_OR_DEPARTMENT_OTHER): Payer: Self-pay

## 2024-04-23 MED ORDER — FLUZONE 0.5 ML IM SUSY
0.5000 mL | PREFILLED_SYRINGE | Freq: Once | INTRAMUSCULAR | 0 refills | Status: AC
  Filled 2024-04-23: qty 0.5, 1d supply, fill #0

## 2024-07-09 DIAGNOSIS — M79672 Pain in left foot: Secondary | ICD-10-CM | POA: Diagnosis not present

## 2024-07-10 ENCOUNTER — Ambulatory Visit (HOSPITAL_COMMUNITY)
Admission: RE | Admit: 2024-07-10 | Discharge: 2024-07-10 | Disposition: A | Discharge status: Home / Self Care | Source: Ambulatory Visit | Attending: Vascular Surgery | Admitting: Vascular Surgery

## 2024-07-10 ENCOUNTER — Other Ambulatory Visit (HOSPITAL_COMMUNITY): Payer: Self-pay | Admitting: Medical

## 2024-07-10 DIAGNOSIS — M79672 Pain in left foot: Secondary | ICD-10-CM | POA: Insufficient documentation

## 2024-07-16 NOTE — Progress Notes (Signed)
 [facility] [address] [place] [address] Dept: [phone] Dept Fax: [phone]  Chronic Care Office Visit  Subjective:    Patient ID: Joan Morgan, female    DOB: Apr 18, 1961, 63 y.o..   MRN: [id number]  No chief complaint on file.  History of Present Illness:  Patient is in today for reassessment of chronic medical conditions.   Joan Morgan has hypertension. She is managed on amlodipine  5 mg daily and valsartan  80 mg daily.    Joan Morgan has a history of hyperlipidemia. She is managed on atorvastatin  20 mg daily.  Past Medical History: Patient Active Problem List   Diagnosis Date Noted   Seborrheic keratosis 01/11/2024   Cataract cortical, senile, bilateral 12/29/2021   Prediabetes 12/31/2019   Chronic pain of both knees 06/20/2019   Constipation 08/16/2017   Annual physical exam 11/18/2015   Hyperlipidemia 10/14/2014   Essential hypertension    Past Surgical History:  Procedure Laterality Date   ABDOMINAL HYSTERECTOMY     CYSTOCELE REPAIR N/A 01/21/2020   Procedure: REAPIR ANTERIOR REPAIR (CYSTOCELE) CYSTOSCOPY;  Surgeon: Gaston Hamilton, MD;  Location: [facility];  Service: Urology;  Laterality: N/A;   TOTAL LAPAROSCOPIC HYSTERECTOMY WITH BILATERAL SALPINGO OOPHORECTOMY Bilateral 01/21/2020   Procedure: TOTAL LAPAROSCOPIC HYSTERECTOMY WITH BILATERAL SALPINGO OOPHORECTOMY;  Surgeon: Cleotilde Ronal RAMAN, MD;  Location: [facility];  Service: Gynecology;  Laterality: Bilateral;   TUBAL LIGATION     VAGINAL PROLAPSE REPAIR N/A 01/21/2020   Procedure: REPAIR VAGINAL VAULT SUSPENSION AND GRIFT;  Surgeon: Gaston Hamilton, MD;  Location: [facility];  Service: Urology;  Laterality: N/A;   Family History  Problem Relation Age of Onset   Hyperlipidemia Mother    Hypertension Mother    Aneurysm Mother    Colon polyps Father    Hyperlipidemia Father    Breast cancer Neg Hx    Colon cancer Neg Hx    Esophageal  cancer Neg Hx    Stomach cancer Neg Hx    Rectal cancer Neg Hx    Outpatient Medications Prior to Visit  Medication Sig Dispense Refill   amLODipine  (NORVASC ) 5 MG tablet Take 1 tablet (5 mg total) by mouth daily. 90 tablet 3   atorvastatin  (LIPITOR) 40 MG tablet Take 1 tablet (40 mg total) by mouth every other day. 90 tablet 3   cholecalciferol (VITAMIN D3) 25 MCG (1000 UNIT) tablet Take 1,000 Units by mouth daily.     cyclobenzaprine  (FLEXERIL ) 5 MG tablet Take 1 tablet (5 mg total) by mouth at bedtime as needed for muscle spasms. 30 tablet 0   magnesium 30 MG tablet Take 30 mg by mouth 2 (two) times daily.     Multiple Vitamin (MULTIVITAMIN) tablet Take 1 tablet by mouth daily.     naproxen  (NAPROSYN ) 500 MG tablet Take 1 tablet (500 mg total) by mouth 2 (two) times daily with a meal. 30 tablet 0   Omega-3 Fatty Acids (FISH OIL) 1000 MG CAPS Take by mouth.     valsartan  (DIOVAN ) 80 MG tablet Take 1 tablet (80 mg total) by mouth daily. 90 tablet 3   No facility-administered medications prior to visit.   Allergies[1]   Objective:   There were no vitals filed for this visit. There is no height or weight on file to calculate BMI.   General: Well developed, well nourished. No acute distress. HEENT: Normocephalic, non-traumatic. PERRL, EOMI. Conjunctiva clear. External ears normal. EAC and TMs normal bilaterally. Nose    clear  without congestion or rhinorrhea. Mucous membranes moist. Oropharynx clear. Good dentition. Neck: Supple. No lymphadenopathy. No thyromegaly. Lungs: Clear to auscultation bilaterally. No wheezing, rales or rhonchi. CV: RRR without murmurs or rubs. Pulses 2+ bilaterally. Abdomen: Soft, non-tender. Bowel sounds positive, normal pitch and frequency. No hepatosplenomegaly. No rebound or guarding. Back: Straight. No CVA tenderness bilaterally. Extremities: Full ROM. No joint swelling or tenderness. No edema noted. Skin: Warm and dry. No rashes. Neuro: CN II-XII  intact. Normal sensation and DTR bilaterally. Psych: Alert and oriented. Normal mood and affect.  Health Maintenance Due  Topic Date Due   Zoster Vaccines- Shingrix (1 of 2) Never done   Pneumococcal Vaccine: 50+ Years (1 of 1 - PCV) Never done   COVID-19 Vaccine (4 - 2025-26 season) 04/01/2024    Imaging  VAS US  LOWER EXTREMITY VENOUS (DVT) Result Date: 07/10/2024 Summary:  RIGHT: - No evidence of common femoral vein obstruction.    LEFT:  - There is no evidence of superficial venous thrombosis.   - A cystic structure is found in the popliteal fossa.    Lab Results {Labs (Optional):29002}  Lab Results  Component Value Date   NA 140 01/11/2024   K 4.0 01/11/2024   CO2 29 01/11/2024   GLUCOSE 107 (H) 01/11/2024   BUN 10 01/11/2024   CREATININE 0.77 01/11/2024   CALCIUM  10.1 01/11/2024   GFR 81.54 01/11/2024   GFRNONAA >60 01/22/2020   Lipid Panel:  Lab Results  Component Value Date   CHOL 185 01/11/2024   HDL 52.70 01/11/2024   LDLCALC 102 (H) 01/11/2024   TRIG 151.0 (H) 01/11/2024     Lab Results  Component Value Date   HGBA1C 6.4 01/11/2024   Assessment & Plan:   Problem List Items Addressed This Visit   None   No follow-ups on file.   Garnette CHRISTELLA Simpler, MD  I,Emily Lagle,acting as a scribe for Garnette CHRISTELLA Simpler, MD.,have documented all relevant documentation on the behalf of Garnette CHRISTELLA Simpler, MD.  I, Garnette CHRISTELLA Simpler, MD, have reviewed all documentation for this visit. The documentation on 07/17/2024 for the exam, diagnosis, procedures, and orders are all accurate and complete.    [1]  Allergies Allergen Reactions   Ace Inhibitors     cough

## 2024-07-16 NOTE — Assessment & Plan Note (Signed)
 Blood pressure is in adequate control. Continue amlodipine  5 mg and valsartan  80 mg daily. I do not have a concern with her occasionally doubling her valsartan .

## 2024-07-16 NOTE — Assessment & Plan Note (Signed)
 Continue atorvastatin 20 mg daily and fish oil.

## 2024-07-17 ENCOUNTER — Encounter: Payer: Self-pay | Admitting: Family Medicine

## 2024-07-17 ENCOUNTER — Ambulatory Visit: Admitting: Family Medicine

## 2024-07-17 VITALS — BP 132/80 | HR 66 | Temp 97.7°F | Ht 62.0 in | Wt 151.2 lb

## 2024-07-17 DIAGNOSIS — R7303 Prediabetes: Secondary | ICD-10-CM

## 2024-07-17 DIAGNOSIS — Z23 Encounter for immunization: Secondary | ICD-10-CM | POA: Diagnosis not present

## 2024-07-17 DIAGNOSIS — I10 Essential (primary) hypertension: Secondary | ICD-10-CM

## 2024-07-17 DIAGNOSIS — E782 Mixed hyperlipidemia: Secondary | ICD-10-CM | POA: Diagnosis not present

## 2024-07-23 DIAGNOSIS — M79672 Pain in left foot: Secondary | ICD-10-CM | POA: Diagnosis not present

## 2025-01-22 ENCOUNTER — Encounter: Admitting: Family Medicine
